# Patient Record
Sex: Female | Born: 1995 | Hispanic: No | Marital: Married | Smoking: Never smoker
Health system: Southern US, Community
[De-identification: ages and names within clinical notes are randomized; demographics above are authoritative.]

## PROBLEM LIST (undated history)

## (undated) DIAGNOSIS — O169 Unspecified maternal hypertension, unspecified trimester: Secondary | ICD-10-CM

## (undated) DIAGNOSIS — O24419 Gestational diabetes mellitus in pregnancy, unspecified control: Secondary | ICD-10-CM

## (undated) HISTORY — DX: Unspecified maternal hypertension, unspecified trimester: O16.9

## (undated) HISTORY — DX: Gestational diabetes mellitus in pregnancy, unspecified control: O24.419

## (undated) HISTORY — PX: NO PAST SURGERIES: SHX2092

---

## 2020-01-01 NOTE — L&D Delivery Note (Signed)
Delivery Note Progressed to complete dilation with urge to push. Had her start pushing, and she did well.  FHR developed decelerations to 40s with contractions, with descent of head Neonatologist called to room  One attempt made at vacuum assistance with one contraciton.  No descent so no further attempts since head wasn't at optimal station.  Dr Shawnie Pons arrived and was able to assist patient with descent with pushes to delivery.     At 6:56 AM a viable and healthy female was delivered via Vaginal, Spontaneous (Presentation: Middle Occiput Posterior).  APGAR: 6, 8; weight  .   Placenta status: Spontaneous, Intact.  Cord: Unknown with the following complications: None.  Cord pH: pending   Anesthesia: Local Episiotomy: None Lacerations: Sulcus Suture Repair: 3.0 vicryl Est. Blood Loss (mL): 856  Mom to postpartum.  Baby to Couplet care / Skin to Skin.  Wynelle Bourgeois 12/22/2020, 8:16 AM

## 2020-06-29 ENCOUNTER — Ambulatory Visit (INDEPENDENT_AMBULATORY_CARE_PROVIDER_SITE_OTHER): Payer: Medicaid Other

## 2020-06-29 DIAGNOSIS — Z3A13 13 weeks gestation of pregnancy: Secondary | ICD-10-CM

## 2020-06-29 DIAGNOSIS — O0990 Supervision of high risk pregnancy, unspecified, unspecified trimester: Secondary | ICD-10-CM

## 2020-06-29 MED ORDER — DOXYLAMINE-PYRIDOXINE 10-10 MG PO TBEC: 2.0000 | DELAYED_RELEASE_TABLET | Freq: Every day | ORAL | 5 refills | Status: DC

## 2020-06-29 NOTE — Progress Notes (Signed)
.    Virtual Visit via Telephone Note  I connected with Joanna Rogers on 06/29/20 at  9:00 AM EDT by telephone and verified that I am speaking with the correct person using two identifiers.  Location: Patient: Joanna Rogers  Provider: Eduard Roux, CMA    I discussed the limitations, risks, security and privacy concerns of performing an evaluation and management service by telephone and the availability of in person appointments. I also discussed with the patient that there may be a patient responsible charge related to this service. The patient expressed understanding and agreed to proceed.   History of Present Illness: PRENATAL INTAKE SUMMARY  Ms. Joanna Rogers presents today New OB Nurse Interview.  OB History    Gravida  1   Para      Term      Preterm      AB      Living        SAB      TAB      Ectopic      Multiple      Live Births             I have reviewed the patient's medical, obstetrical, social, and family histories, medications, and available lab results.  SUBJECTIVE She complains of nausea with vomiting for 2 days.    Observations/Objective: Initial nurse interview for history/labs (New OB)  EDD: 01/04/2021 G1P0 FHT: non face to face interview  GENERAL APPEARANCE: alert, oriented to person, place and time, non face to face interview   Assessment and Plan: Prenatal Care - [facility]  High Risk pregnancy - HTN and suspected diabetes (pt unsure) Labs/Pap to be completed at NOB visit 7/6 Download MyChart Babyscripts discussed in detail  Pt has a BP cuff and scale  Diclegis sent to the pharmacy for N&V - if sx's do not subside, contact the office or report to MAU after hours   Follow Up Instructions:   I discussed the assessment and treatment plan with the patient. The patient was provided an opportunity to ask questions and all were answered. The patient agreed with the plan and demonstrated an understanding of the instructions.   The patient  was advised to call back or seek an in-person evaluation if the symptoms worsen or if the condition fails to improve as anticipated.  I provided 20 minutes of non-face-to-face time during this encounter.   Dalphine Handing, CMA

## 2020-07-05 ENCOUNTER — Encounter: Payer: Self-pay | Admitting: Obstetrics and Gynecology

## 2020-07-05 ENCOUNTER — Other Ambulatory Visit (HOSPITAL_COMMUNITY)
Admission: RE | Admit: 2020-07-05 | Discharge: 2020-07-05 | Disposition: A | Discharge status: Home / Self Care | Payer: Medicaid Other | Source: Ambulatory Visit | Attending: Obstetrics and Gynecology | Admitting: Obstetrics and Gynecology

## 2020-07-05 ENCOUNTER — Other Ambulatory Visit: Payer: Self-pay

## 2020-07-05 ENCOUNTER — Ambulatory Visit (INDEPENDENT_AMBULATORY_CARE_PROVIDER_SITE_OTHER): Payer: Medicaid Other | Admitting: Obstetrics and Gynecology

## 2020-07-05 VITALS — BP 139/95 | HR 111 | Wt 260.3 lb

## 2020-07-05 DIAGNOSIS — Z3A13 13 weeks gestation of pregnancy: Secondary | ICD-10-CM

## 2020-07-05 DIAGNOSIS — O99511 Diseases of the respiratory system complicating pregnancy, first trimester: Secondary | ICD-10-CM

## 2020-07-05 DIAGNOSIS — O0991 Supervision of high risk pregnancy, unspecified, first trimester: Secondary | ICD-10-CM | POA: Diagnosis not present

## 2020-07-05 DIAGNOSIS — O0990 Supervision of high risk pregnancy, unspecified, unspecified trimester: Secondary | ICD-10-CM | POA: Diagnosis present

## 2020-07-05 DIAGNOSIS — O10912 Unspecified pre-existing hypertension complicating pregnancy, second trimester: Secondary | ICD-10-CM | POA: Insufficient documentation

## 2020-07-05 DIAGNOSIS — O10911 Unspecified pre-existing hypertension complicating pregnancy, first trimester: Secondary | ICD-10-CM

## 2020-07-05 DIAGNOSIS — E7439 Other disorders of intestinal carbohydrate absorption: Secondary | ICD-10-CM

## 2020-07-05 DIAGNOSIS — J45909 Unspecified asthma, uncomplicated: Secondary | ICD-10-CM | POA: Diagnosis not present

## 2020-07-05 DIAGNOSIS — O99810 Abnormal glucose complicating pregnancy: Secondary | ICD-10-CM

## 2020-07-05 DIAGNOSIS — O99512 Diseases of the respiratory system complicating pregnancy, second trimester: Secondary | ICD-10-CM | POA: Insufficient documentation

## 2020-07-05 MED ORDER — PROMETHAZINE HCL 25 MG RE SUPP: 25.0000 mg | Freq: Four times a day (QID) | RECTAL | 1 refills | Status: DC | PRN

## 2020-07-05 MED ORDER — LABETALOL HCL 100 MG PO TABS: 100.0000 mg | ORAL_TABLET | Freq: Two times a day (BID) | ORAL | 10 refills | Status: DC

## 2020-07-05 NOTE — Progress Notes (Signed)
Subjective:  Joanna Rogers is a G1P0 [redacted]w[redacted]d being seen today for her first obstetrical visit.  Her obstetrical history is significant for non-applicable.  This is her first pregnancy.. Patient does intend to breast feed. Pregnancy history fully reviewed.  Patient reports no complaints.  She does state that she has a history of hypertension, treated with Lisinopril.  She has not been taking the medication for a few months.  The patient states that she was told by her PCP about 1 year ago, that her Hb A1C was elevated.  She was started on metformin.  The patient has not been taking the medication lately secondary to increased nausea.  BP (!) 139/95   Pulse (!) 111   Wt 118.1 kg   LMP 03/30/2020   BMI 38.44 kg/m   HISTORY: OB History  Gravida Para Term Preterm AB Living  1            SAB TAB Ectopic Multiple Live Births               # Outcome Date GA Lbr Len/2nd Weight Sex Delivery Anes PTL Lv  1 Current             Past Medical History:  Diagnosis Date  . Hypertension affecting pregnancy     History reviewed. No pertinent surgical history.  Family History  Problem Relation Age of Onset  . High Cholesterol Mother   . Hypertension Father   . Diabetes Father   . Kidney Stones Father   . Breast cancer Maternal Grandmother   . Cervical cancer Maternal Grandmother   . Hypertension Maternal Grandfather   . Kidney Stones Paternal Grandmother   . Clotting disorder Paternal Grandmother   . Diabetes Paternal Grandfather   . Hypertension Paternal Grandfather      Exam  BP (!) 139/95   Pulse (!) 111   Wt 118.1 kg   LMP 03/30/2020   BMI 38.44 kg/m   Chaperone present during exam  CONSTITUTIONAL: Well-developed, well-nourished female in no acute distress.  HENT:  Normocephalic, atraumatic, External right and left ear normal. Oropharynx is clear and moist EYES: Conjunctivae and EOM are normal. Pupils are equal, round, and reactive to light. No scleral icterus.  NECK:  Normal range of motion, supple, no masses.  Normal thyroid.  CARDIOVASCULAR: Normal heart rate noted, regular rhythm RESPIRATORY: Clear to auscultation bilaterally. Effort and breath sounds normal, no problems with respiration noted. BREASTS: Symmetric in size. No masses, skin changes, nipple drainage, or lymphadenopathy. ABDOMEN: Soft, normal bowel sounds, no distention noted.  No tenderness, rebound or guarding.  PELVIC: Normal appearing external genitalia; normal appearing vaginal mucosa and cervix. No abnormal discharge noted. Normal uterine size, no other palpable masses, no uterine or adnexal tenderness. MUSCULOSKELETAL: Normal range of motion. No tenderness.  No cyanosis, clubbing, or edema.  2+ distal pulses. SKIN: Skin is warm and dry. No rash noted. Not diaphoretic. No erythema. No pallor. NEUROLOGIC: Alert and oriented to person, place, and time. Normal reflexes, muscle tone coordination. No cranial nerve deficit noted. PSYCHIATRIC: Normal mood and affect. Normal behavior. Normal judgment and thought content.    Assessment:    Pregnancy: G1P0 Patient Active Problem List   Diagnosis Date Noted  . Chronic hypertension in obstetric context in second trimester 07/05/2020  . Asthma affecting pregnancy in second trimester 07/05/2020  . Glucose intolerance 07/05/2020  . Supervision of high risk pregnancy, antepartum 06/29/2020      Plan:   1. Supervision of high risk pregnancy,  antepartum Prenatal labs, including Pap smear and cultures collected today. OB ultrasound ordered. Genetic screening ordered.  2. Chronic hypertension in obstetric context in second trimester Patient informed to not restart the Lisinopril. Rx for Labetalol sent to pharmacy. Baseline 24 hour urine and CMP ordered. Patient to return in 1 week for BP check.  3. Asthma affecting pregnancy in second trimester Patient to continue with current inhalers.  She states that she has multiple refills.    4.  Glucose intolerance We will not restart Metformin at this time. Hb A1c ordered.     Problem list reviewed and updated. 50% of 40 min visit spent on counseling and coordination of care.  Patient voiced understanding of plan of care.  All questions answered.     Levie Heritage 07/05/2020

## 2020-07-05 NOTE — Progress Notes (Signed)
Pt presents for NOB visit c/o nausea and vomiting.  Unable to pick up Diclegis d/t insurance issues Pap due today

## 2020-07-05 NOTE — Addendum Note (Signed)
Addended by: Dalphine Handing on: 07/05/2020 04:31 PM   Modules accepted: Orders

## 2020-07-06 LAB — HEMOGLOBIN A1C
Est. average glucose Bld gHb Est-mCnc: 143 mg/dL
Hgb A1c MFr Bld: 6.6 % — ABNORMAL HIGH (ref 4.8–5.6)

## 2020-07-06 LAB — CBC/D/PLT+RPR+RH+ABO+RUB AB...
Antibody Screen: NEGATIVE
Basophils Absolute: 0 10*3/uL (ref 0.0–0.2)
Basos: 0 %
EOS (ABSOLUTE): 0.1 10*3/uL (ref 0.0–0.4)
Eos: 1 %
HCV Ab: <0.1 s/co ratio (ref 0.0–0.9)
HIV Screen 4th Generation wRfx: NONREACTIVE
Hematocrit: 39.2 % (ref 34.0–46.6)
Hemoglobin: 12.5 g/dL (ref 11.1–15.9)
Hepatitis B Surface Ag: NEGATIVE
Immature Grans (Abs): 0.1 10*3/uL (ref 0.0–0.1)
Immature Granulocytes: 1 %
Lymphocytes Absolute: 2.1 10*3/uL (ref 0.7–3.1)
Lymphs: 23 %
MCH: 27.2 pg (ref 26.6–33.0)
MCHC: 31.9 g/dL (ref 31.5–35.7)
MCV: 85 fL (ref 79–97)
Monocytes Absolute: 0.6 10*3/uL (ref 0.1–0.9)
Monocytes: 6 %
Neutrophils Absolute: 6 10*3/uL (ref 1.4–7.0)
Neutrophils: 69 %
Platelets: 269 10*3/uL (ref 150–450)
RBC: 4.59 x10E6/uL (ref 3.77–5.28)
RDW: 13.4 % (ref 11.7–15.4)
RPR Ser Ql: NONREACTIVE
Rh Factor: POSITIVE
Rubella Antibodies, IGG: 2.12 index (ref >0.99)
WBC: 8.8 10*3/uL (ref 3.4–10.8)

## 2020-07-06 LAB — CERVICOVAGINAL ANCILLARY ONLY
Bacterial Vaginitis (gardnerella): NEGATIVE
Candida Glabrata: NEGATIVE
Candida Vaginitis: NEGATIVE
Chlamydia: NEGATIVE
Comment: NEGATIVE
Comment: NEGATIVE
Comment: NEGATIVE
Comment: NEGATIVE
Comment: NEGATIVE
Comment: NORMAL
Neisseria Gonorrhea: NEGATIVE
Trichomonas: NEGATIVE

## 2020-07-06 LAB — CYTOLOGY - PAP: Diagnosis: NEGATIVE

## 2020-07-06 LAB — HCV INTERPRETATION

## 2020-07-07 ENCOUNTER — Other Ambulatory Visit: Payer: Self-pay | Admitting: Obstetrics and Gynecology

## 2020-07-07 LAB — URINE CULTURE, OB REFLEX

## 2020-07-07 LAB — CULTURE, OB URINE

## 2020-07-07 NOTE — Addendum Note (Signed)
Addended by: Eldred Manges on: 07/07/2020 01:08 PM   Modules accepted: Orders

## 2020-07-07 NOTE — Addendum Note (Signed)
Addended by: Jenene Slicker on: 07/07/2020 01:16 PM   Modules accepted: Orders

## 2020-07-08 LAB — PROTEIN, URINE, 24 HOUR
Protein, 24H Urine: 291 mg/24 hr — ABNORMAL HIGH (ref 30–150)
Protein, Ur: 18.2 mg/dL

## 2020-07-08 LAB — CREATININE, URINE, 24 HOUR
Creatinine, 24H Ur: 1541 mg/24 hr (ref 800–1800)
Creatinine, Urine: 96.3 mg/dL

## 2020-07-12 ENCOUNTER — Other Ambulatory Visit: Payer: Self-pay

## 2020-07-12 ENCOUNTER — Ambulatory Visit (INDEPENDENT_AMBULATORY_CARE_PROVIDER_SITE_OTHER): Payer: Medicaid Other | Admitting: Obstetrics and Gynecology

## 2020-07-12 ENCOUNTER — Encounter: Payer: Self-pay | Admitting: Obstetrics and Gynecology

## 2020-07-12 VITALS — BP 135/93 | HR 120 | Wt 258.0 lb

## 2020-07-12 DIAGNOSIS — O0992 Supervision of high risk pregnancy, unspecified, second trimester: Secondary | ICD-10-CM

## 2020-07-12 DIAGNOSIS — O0990 Supervision of high risk pregnancy, unspecified, unspecified trimester: Secondary | ICD-10-CM

## 2020-07-12 DIAGNOSIS — O10912 Unspecified pre-existing hypertension complicating pregnancy, second trimester: Secondary | ICD-10-CM

## 2020-07-12 DIAGNOSIS — J45909 Unspecified asthma, uncomplicated: Secondary | ICD-10-CM

## 2020-07-12 DIAGNOSIS — O99512 Diseases of the respiratory system complicating pregnancy, second trimester: Secondary | ICD-10-CM

## 2020-07-12 MED ORDER — ASPIRIN 81 MG PO CHEW: 81.0000 mg | CHEWABLE_TABLET | Freq: Every day | ORAL | Status: DC

## 2020-07-12 NOTE — Progress Notes (Signed)
   PRENATAL VISIT NOTE  Subjective:  Joanna Rogers is a 24 y.o. G1P0 at [redacted]w[redacted]d being seen today for ongoing prenatal care.  She is currently monitored for the following issues for this high-risk pregnancy and has Supervision of high risk pregnancy, antepartum; Chronic hypertension in obstetric context in second trimester; Asthma affecting pregnancy in second trimester; and Glucose intolerance on their problem list.  Patient doing well with no acute concerns today. She reports no complaints.  Contractions: Not present. Vag. Bleeding: None.  Movement: Present. Denies leaking of fluid.    Hgb A1c slightly elevated.  Pt states when she was seen elsewhere her blood sugars were becoming elevated. The following portions of the patient's history were reviewed and updated as appropriate: allergies, current medications, past family history, past medical history, past social history, past surgical history and problem list. Problem list updated.  Objective:   Vitals:   07/12/20 1553  BP: (!) 135/93  Pulse: (!) 120  Weight: 258 lb (117 kg)    Fetal Status: Fetal Heart Rate (bpm): 160   Movement: Present     General:  Alert, oriented and cooperative. Patient is in no acute distress.  Skin: Skin is warm and dry. No rash noted.   Cardiovascular: Normal heart rate noted  Respiratory: Normal respiratory effort, no problems with respiration noted  Abdomen: Soft, obese,gravid, appropriate for gestational age.  Pain/Pressure: Absent     Pelvic: Cervical exam deferred        Extremities: Normal range of motion.     Mental Status:  Normal mood and affect. Normal behavior. Normal judgment and thought content.   Assessment and Plan:  Pregnancy: G1P0 at [redacted]w[redacted]d  1. Supervision of high risk pregnancy, antepartum Check BP and 2 hour GTT in 2 weeks, pt to put BP in mychart  2. Chronic hypertension in obstetric context in second trimester Start low dose ASA now  3. Asthma affecting pregnancy in second  trimester   Preterm labor symptoms and general obstetric precautions including but not limited to vaginal bleeding, contractions, leaking of fluid and fetal movement were reviewed in detail with the patient.  Please refer to After Visit Summary for other counseling recommendations.   Return in about 4 weeks (around 08/09/2020) for [facility], in person, AFP.   Mariel Aloe, MD

## 2020-07-12 NOTE — Patient Instructions (Signed)
Second Trimester of Pregnancy  The second trimester is from week 14 through week 27 (month 4 through 6). This is often the time in pregnancy that you feel your best. Often times, morning sickness has lessened or quit. You may have more energy, and you may get hungry more often. Your unborn baby is growing rapidly. At the end of the sixth month, he or she is about 9 inches long and weighs about 1 pounds. You will likely feel the baby move between 18 and 20 weeks of pregnancy. Follow these instructions at home: Medicines  Take over-the-counter and prescription medicines only as told by your doctor. Some medicines are safe and some medicines are not safe during pregnancy.  Take a prenatal vitamin that contains at least 600 micrograms (mcg) of folic acid.  If you have trouble pooping (constipation), take medicine that will make your stool soft (stool softener) if your doctor approves. Eating and drinking   Eat regular, healthy meals.  Avoid raw meat and uncooked cheese.  If you get low calcium from the food you eat, talk to your doctor about taking a daily calcium supplement.  Avoid foods that are high in fat and sugars, such as fried and sweet foods.  If you feel sick to your stomach (nauseous) or throw up (vomit): ? Eat 4 or 5 small meals a day instead of 3 large meals. ? Try eating a few soda crackers. ? Drink liquids between meals instead of during meals.  To prevent constipation: ? Eat foods that are high in fiber, like fresh fruits and vegetables, whole grains, and beans. ? Drink enough fluids to keep your pee (urine) clear or pale yellow. Activity  Exercise only as told by your doctor. Stop exercising if you start to have cramps.  Do not exercise if it is too hot, too humid, or if you are in a place of great height (high altitude).  Avoid heavy lifting.  Wear low-heeled shoes. Sit and stand up straight.  You can continue to have sex unless your doctor tells you not  to. Relieving pain and discomfort  Wear a good support bra if your breasts are tender.  Take warm water baths (sitz baths) to soothe pain or discomfort caused by hemorrhoids. Use hemorrhoid cream if your doctor approves.  Rest with your legs raised if you have leg cramps or low back pain.  If you develop puffy, bulging veins (varicose veins) in your legs: ? Wear support hose or compression stockings as told by your doctor. ? Raise (elevate) your feet for 15 minutes, 3-4 times a day. ? Limit salt in your food. Prenatal care  Write down your questions. Take them to your prenatal visits.  Keep all your prenatal visits as told by your doctor. This is important. Safety  Wear your seat belt when driving.  Make a list of emergency phone numbers, including numbers for family, friends, the hospital, and police and fire departments. General instructions  Ask your doctor about the right foods to eat or for help finding a counselor, if you need these services.  Ask your doctor about local prenatal classes. Begin classes before month 6 of your pregnancy.  Do not use hot tubs, steam rooms, or saunas.  Do not douche or use tampons or scented sanitary pads.  Do not cross your legs for long periods of time.  Visit your dentist if you have not done so. Use a soft toothbrush to brush your teeth. Floss gently.  Avoid all smoking, herbs,   and alcohol. Avoid drugs that are not approved by your doctor.  Do not use any products that contain nicotine or tobacco, such as cigarettes and e-cigarettes. If you need help quitting, ask your doctor.  Avoid cat litter boxes and soil used by cats. These carry germs that can cause birth defects in the baby and can cause a loss of your baby (miscarriage) or stillbirth. Contact a doctor if:  You have mild cramps or pressure in your lower belly.  You have pain when you pee (urinate).  You have bad smelling fluid coming from your vagina.  You continue to  feel sick to your stomach (nauseous), throw up (vomit), or have watery poop (diarrhea).  You have a nagging pain in your belly area.  You feel dizzy. Get help right away if:  You have a fever.  You are leaking fluid from your vagina.  You have spotting or bleeding from your vagina.  You have severe belly cramping or pain.  You lose or gain weight rapidly.  You have trouble catching your breath and have chest pain.  You notice sudden or extreme puffiness (swelling) of your face, hands, ankles, feet, or legs.  You have not felt the baby move in over an hour.  You have severe headaches that do not go away when you take medicine.  You have trouble seeing. Summary  The second trimester is from week 14 through week 27 (months 4 through 6). This is often the time in pregnancy that you feel your best.  To take care of yourself and your unborn baby, you will need to eat healthy meals, take medicines only if your doctor tells you to do so, and do activities that are safe for you and your baby.  Call your doctor if you get sick or if you notice anything unusual about your pregnancy. Also, call your doctor if you need help with the right food to eat, or if you want to know what activities are safe for you. This information is not intended to replace advice given to you by your health care provider. Make sure you discuss any questions you have with your health care provider. Document Revised: 04/10/2019 Document Reviewed: 01/22/2017 Elsevier Patient Education  2020 [employer]. Hypertension During Pregnancy Hypertension is also called high blood pressure. High blood pressure means that the force of your blood moving in your body is too strong. It can cause problems for you and your baby. Different types of high blood pressure can happen during pregnancy. The types are:  High blood pressure before you got pregnant. This is called chronic hypertension.  This can continue during your  pregnancy. Your doctor will want to keep checking your blood pressure. You may need medicine to keep your blood pressure under control while you are pregnant. You will need follow-up visits after you have your baby.  High blood pressure that goes up during pregnancy when it was normal before. This is called gestational hypertension. It will usually get better after you have your baby, but your doctor will need to watch your blood pressure to make sure that it is getting better.  Very high blood pressure during pregnancy. This is called preeclampsia. Very high blood pressure is an emergency that needs to be checked and treated right away.  You may develop very high blood pressure after giving birth. This is called postpartum preeclampsia. This usually occurs within 48 hours after childbirth but may occur up to 6 weeks after giving birth. This is  rare. How does this affect me? If you have high blood pressure during pregnancy, you have a higher chance of developing high blood pressure:  As you get older.  If you get pregnant again. In some cases, high blood pressure during pregnancy can cause:  Stroke.  Heart attack.  Damage to the kidneys, lungs, or liver.  Preeclampsia.  Jerky movements you cannot control (convulsions or seizures).  Problems with the placenta. How does this affect my baby? Your baby may:  Be born early.  Not weigh as much as he or she should.  Not handle labor well, leading to a c-section birth. What are the risks?  Having high blood pressure during a past pregnancy.  Being overweight.  Being 40 years old or older.  Being pregnant for the first time.  Being pregnant with more than one baby.  Becoming pregnant using fertility methods, such as IVF.  Having other problems, such as diabetes, or kidney disease.  Having family members who have high blood pressure. What can I do to lower my risk?   Keep a healthy weight.  Eat a healthy diet.  Follow  what your doctor tells you about treating any medical problems that you had before becoming pregnant. It is very important to go to all of your doctor visits. Your doctor will check your blood pressure and make sure that your pregnancy is progressing as it should. Treatment should start early if a problem is found. How is this treated? Treatment for high blood pressure during pregnancy can differ depending on the type of high blood pressure you have and how serious it is.  You may need to take blood pressure medicine.  If you have been taking medicine for your blood pressure, you may need to change the medicine during pregnancy if it is not safe for your baby.  If your doctor thinks that you could get very high blood pressure, he or she may tell you to take a low-dose aspirin during your pregnancy.  If you have very high blood pressure, you may need to stay in the hospital so you and your baby can be watched closely. You may also need to take medicine to lower your blood pressure. This medicine may be given by mouth or through an IV tube.  In some cases, if your condition gets worse, you may need to have your baby early. Follow these instructions at home: Eating and drinking   Drink enough fluid to keep your pee (urine) pale yellow.  Avoid caffeine. Lifestyle  Do not use any products that contain nicotine or tobacco, such as cigarettes, e-cigarettes, and chewing tobacco. If you need help quitting, ask your doctor.  Do not use alcohol or drugs.  Avoid stress.  Rest and get plenty of sleep.  Regular exercise can help. Ask your doctor what kinds of exercise are best for you. General instructions  Take over-the-counter and prescription medicines only as told by your doctor.  Keep all prenatal and follow-up visits as told by your doctor. This is important. Contact a doctor if:  You have symptoms that your doctor told you to watch for, such  as: ? Headaches. ? Nausea. ? Vomiting. ? Belly (abdominal) pain. ? Dizziness. ? Light-headedness. Get help right away if:  You have: ? Very bad belly pain that does not get better with treatment. ? A very bad headache that does not get better. ? Vomiting that does not get better. ? Sudden, fast weight gain. ? Sudden swelling in your  hands, ankles, or face. ? Bleeding from your vagina. ? Blood in your pee. ? Blurry vision. ? Double vision. ? Shortness of breath. ? Chest pain. ? Weakness on one side of your body. ? Trouble talking.  Your baby is not moving as much as usual. Summary  High blood pressure is also called hypertension.  High blood pressure means that the force of your blood moving in your body is too strong.  High blood pressure can cause problems for you and your baby.  Keep all follow-up visits as told by your doctor. This is important. This information is not intended to replace advice given to you by your health care provider. Make sure you discuss any questions you have with your health care provider. Document Revised: 04/09/2019 Document Reviewed: 01/13/2019 Elsevier Patient Education  2020 [employer].

## 2020-07-12 NOTE — Progress Notes (Signed)
Pt started on Labetalol today- has had one dose.

## 2020-07-18 ENCOUNTER — Encounter: Payer: Self-pay | Admitting: Obstetrics and Gynecology

## 2020-07-20 NOTE — Progress Notes (Signed)
Patient ID: Joanna Rogers, female   DOB: 05/02/1996, 24 y.o.   MRN: [id number] Patient was assessed and managed by nursing staff during this encounter. I have reviewed the chart and agree with the documentation and plan. I have also made any necessary editorial changes.  Scheryl Darter, MD 07/20/2020 1:39 PM

## 2020-07-27 ENCOUNTER — Other Ambulatory Visit: Payer: Self-pay

## 2020-07-27 ENCOUNTER — Other Ambulatory Visit: Payer: Medicaid Other

## 2020-07-27 ENCOUNTER — Ambulatory Visit: Payer: Medicaid Other

## 2020-07-27 DIAGNOSIS — O0990 Supervision of high risk pregnancy, unspecified, unspecified trimester: Secondary | ICD-10-CM

## 2020-07-27 DIAGNOSIS — R7309 Other abnormal glucose: Secondary | ICD-10-CM

## 2020-07-27 DIAGNOSIS — O10912 Unspecified pre-existing hypertension complicating pregnancy, second trimester: Secondary | ICD-10-CM

## 2020-07-27 MED ORDER — LABETALOL HCL 200 MG PO TABS
200.0000 mg | ORAL_TABLET | Freq: Two times a day (BID) | ORAL | 3 refills | Status: DC
Start: 2020-07-27 — End: 2020-10-31

## 2020-07-27 NOTE — Progress Notes (Signed)
..  Subjective:  Joanna Rogers is a 24 y.o. female here for BP check.   Hypertension ROS: taking medications as instructed, no medication side effects noted, no TIA's, no chest pain on exertion, no dyspnea on exertion and no swelling of ankles.    Objective:  BP (!) 132/88   Pulse 94   LMP 03/30/2020   Appearance alert, well appearing, and in no distress. General exam BP noted to be well controlled today in office.    Assessment:   Blood Pressure borderline controlled.   Plan:  Orders and follow up as documented in patient record.  Provider ordered to increase labetalol to 200mg  two times daily, advised pt of the change in prescription, pt agreed. Advised to report any abnormal symptoms and follow up at next appt 08-09-20.

## 2020-07-28 ENCOUNTER — Telehealth: Payer: Self-pay

## 2020-07-28 ENCOUNTER — Other Ambulatory Visit (INDEPENDENT_AMBULATORY_CARE_PROVIDER_SITE_OTHER): Payer: Medicaid Other | Admitting: Obstetrics and Gynecology

## 2020-07-28 ENCOUNTER — Other Ambulatory Visit: Payer: Self-pay

## 2020-07-28 DIAGNOSIS — O99810 Abnormal glucose complicating pregnancy: Secondary | ICD-10-CM

## 2020-07-28 DIAGNOSIS — O24419 Gestational diabetes mellitus in pregnancy, unspecified control: Secondary | ICD-10-CM

## 2020-07-28 LAB — GLUCOSE TOLERANCE, 2 HOURS W/ 1HR
Glucose, 1 hour: 165 mg/dL (ref 65–179)
Glucose, 2 hour: 158 mg/dL — ABNORMAL HIGH (ref 65–152)
Glucose, Fasting: 100 mg/dL — ABNORMAL HIGH (ref 65–91)

## 2020-07-28 MED ORDER — ACCU-CHEK GUIDE VI STRP: ORAL_STRIP | 12 refills | Status: DC

## 2020-07-28 MED ORDER — ACCU-CHEK SOFTCLIX LANCETS MISC: 1.0000 | Freq: Four times a day (QID) | 12 refills | Status: DC

## 2020-07-28 MED ORDER — ACCU-CHEK GUIDE W/DEVICE KIT: 1.0000 | PACK | Freq: Four times a day (QID) | 0 refills | Status: DC

## 2020-07-28 NOTE — Progress Notes (Signed)
Rx sent for Diabetes supplies  Pt not ava to make aware.  LVM

## 2020-07-28 NOTE — Telephone Encounter (Signed)
TC to pt regarding 2hr GTT No answer lvm supplies sent

## 2020-07-28 NOTE — Telephone Encounter (Signed)
-----   Message from Warden Fillers, MD sent at 07/28/2020  1:23 PM EDT ----- 2 hour GTT failed, will refer to diabetic teaching

## 2020-08-03 ENCOUNTER — Other Ambulatory Visit: Payer: Self-pay

## 2020-08-03 ENCOUNTER — Encounter: Payer: Medicaid Other | Attending: Obstetrics and Gynecology | Admitting: Registered"

## 2020-08-03 DIAGNOSIS — O24419 Gestational diabetes mellitus in pregnancy, unspecified control: Secondary | ICD-10-CM | POA: Diagnosis present

## 2020-08-04 NOTE — Progress Notes (Signed)
Order placed for diabetes teaching

## 2020-08-05 ENCOUNTER — Encounter: Payer: Self-pay | Admitting: Registered"

## 2020-08-05 DIAGNOSIS — O24419 Gestational diabetes mellitus in pregnancy, unspecified control: Secondary | ICD-10-CM | POA: Insufficient documentation

## 2020-08-05 NOTE — Progress Notes (Signed)
Patient was seen on 08/03/20 for Gestational Diabetes self-management class at [facility]. The following learning objectives were met by the patient during this course: ° °· States the definition of Gestational Diabetes °· States why dietary management is important in controlling blood glucose °· Describes the effects each nutrient has on blood glucose levels °· Demonstrates ability to create a balanced meal plan °· Demonstrates carbohydrate counting  °· States when to check blood glucose levels °· Demonstrates proper blood glucose monitoring techniques °· States the effect of stress and exercise on blood glucose levels °· States the importance of limiting caffeine and abstaining from alcohol and smoking ° °Blood glucose monitor given: none ° °Patient instructed to monitor glucose levels: °FBS: 60 - <95; 1 hour: <140; 2 hour: <120 ° °Patient received handouts: °· Nutrition Diabetes and Pregnancy, including carb counting list ° °Patient will be seen for follow-up as needed. °

## 2020-08-06 NOTE — Progress Notes (Signed)
Patient ID: Joanna Rogers, female   DOB: 07/18/1996, 24 y.o.   MRN: [id number] Patient was assessed and managed by nursing staff during this encounter. I have reviewed the chart and agree with the documentation and plan. I have also made any necessary editorial changes.  Scheryl Darter, MD 08/06/2020 6:48 PM

## 2020-08-09 ENCOUNTER — Ambulatory Visit (INDEPENDENT_AMBULATORY_CARE_PROVIDER_SITE_OTHER): Payer: Medicaid Other | Admitting: Obstetrics and Gynecology

## 2020-08-09 ENCOUNTER — Ambulatory Visit: Payer: Medicaid Other

## 2020-08-09 ENCOUNTER — Ambulatory Visit: Payer: Medicaid Other | Attending: Obstetrics and Gynecology

## 2020-08-09 ENCOUNTER — Other Ambulatory Visit: Payer: Self-pay | Admitting: *Deleted

## 2020-08-09 ENCOUNTER — Ambulatory Visit: Payer: Medicaid Other | Admitting: *Deleted

## 2020-08-09 ENCOUNTER — Other Ambulatory Visit: Payer: Self-pay

## 2020-08-09 VITALS — BP 120/81 | HR 98

## 2020-08-09 VITALS — BP 139/90 | HR 109 | Wt 261.1 lb

## 2020-08-09 DIAGNOSIS — O0990 Supervision of high risk pregnancy, unspecified, unspecified trimester: Secondary | ICD-10-CM

## 2020-08-09 DIAGNOSIS — E669 Obesity, unspecified: Secondary | ICD-10-CM | POA: Diagnosis not present

## 2020-08-09 DIAGNOSIS — O10912 Unspecified pre-existing hypertension complicating pregnancy, second trimester: Secondary | ICD-10-CM

## 2020-08-09 DIAGNOSIS — O99512 Diseases of the respiratory system complicating pregnancy, second trimester: Secondary | ICD-10-CM

## 2020-08-09 DIAGNOSIS — O132 Gestational [pregnancy-induced] hypertension without significant proteinuria, second trimester: Secondary | ICD-10-CM

## 2020-08-09 DIAGNOSIS — O24415 Gestational diabetes mellitus in pregnancy, controlled by oral hypoglycemic drugs: Secondary | ICD-10-CM | POA: Diagnosis not present

## 2020-08-09 DIAGNOSIS — Z363 Encounter for antenatal screening for malformations: Secondary | ICD-10-CM | POA: Diagnosis not present

## 2020-08-09 DIAGNOSIS — J45909 Unspecified asthma, uncomplicated: Secondary | ICD-10-CM

## 2020-08-09 DIAGNOSIS — O99891 Other specified diseases and conditions complicating pregnancy: Secondary | ICD-10-CM

## 2020-08-09 DIAGNOSIS — Z3A18 18 weeks gestation of pregnancy: Secondary | ICD-10-CM

## 2020-08-09 DIAGNOSIS — O99212 Obesity complicating pregnancy, second trimester: Secondary | ICD-10-CM

## 2020-08-09 DIAGNOSIS — O24419 Gestational diabetes mellitus in pregnancy, unspecified control: Secondary | ICD-10-CM

## 2020-08-09 DIAGNOSIS — I10 Essential (primary) hypertension: Secondary | ICD-10-CM | POA: Diagnosis present

## 2020-08-09 DIAGNOSIS — O10919 Unspecified pre-existing hypertension complicating pregnancy, unspecified trimester: Secondary | ICD-10-CM

## 2020-08-09 LAB — POCT URINALYSIS DIPSTICK OB: Glucose, UA: NEGATIVE

## 2020-08-09 MED ORDER — GLYBURIDE 2.5 MG PO TABS: 2.5000 mg | ORAL_TABLET | Freq: Two times a day (BID) | ORAL | 3 refills | Status: DC

## 2020-08-09 NOTE — Progress Notes (Signed)
Subjective:  Joanna Rogers is a 24 y.o. G1P0 at [redacted]w[redacted]d being seen today for ongoing prenatal care.  She is currently monitored for the following issues for this high-risk pregnancy and has Supervision of high risk pregnancy, antepartum; Chronic hypertension in obstetric context in second trimester; Asthma affecting pregnancy in second trimester; Glucose intolerance; and Gestational diabetes mellitus (GDM), antepartum on their problem list.  GDM: Patient taking metformin 500 mg daily even-though she was told to not restart the metformin..  Reports no hypoglycemic episodes.  Tolerating medication well Fasting: 106-140 2hr PP: 110-159  Patient reports no complaints.  Contractions: Not present. Vag. Bleeding: None.  Movement: Present. Denies leaking of fluid.   The following portions of the patient's history were reviewed and updated as appropriate: allergies, current medications, past family history, past medical history, past social history, past surgical history and problem list. Problem list updated.  Objective:   Vitals:   08/09/20 1128  BP: 139/90  Pulse: (!) 109  Weight: 261 lb 1.6 oz (118.4 kg)    Fetal Status: Fetal Heart Rate (bpm): 153   Movement: Present     General:  Alert, oriented and cooperative. Patient is in no acute distress.  Skin: Skin is warm and dry. No rash noted.   Cardiovascular: Normal heart rate noted  Respiratory: Normal respiratory effort, no problems with respiration noted  Abdomen: Soft, gravid, appropriate for gestational age. Pain/Pressure: Absent     Pelvic: Vag. Bleeding: None     Cervical exam deferred        Extremities: Normal range of motion.  Edema: None  Mental Status: Normal mood and affect. Normal behavior. Normal judgment and thought content.   Urinalysis:      Assessment and Plan:  Pregnancy: G1P0 at [redacted]w[redacted]d  1. Supervision of high risk pregnancy, antepartum  - AFP, Serum, Open Spina Bifida - POC Urinalysis Dipstick OB  2. Gestational  diabetes mellitus (GDM), antepartum, gestational diabetes method of control unspecified - Patient to discontinue Metformin secondary to [facility] in pregnancy. - Insulin therapy recommended.  Patient declines.  Will start glyburide 2.5 mg bid. - Patient to continue glucose log.  3. Chronic hypertension in obstetric context in second trimester - Patient took her BP med right before the appointment this morning.  She is to start taking her morning dose by 8am.  She will return in 1 week for a BP check. - Continue ASA.  4. Asthma affecting pregnancy in second trimester - Stable.  Preterm labor symptoms and general obstetric precautions including but not limited to vaginal bleeding, contractions, leaking of fluid and fetal movement were reviewed in detail with the patient. Please refer to After Visit Summary for other counseling recommendations.  Return in about 1 week (around 08/16/2020) for HROB - BP and glucose check.   Johnny Bridge, MD

## 2020-08-11 LAB — AFP, SERUM, OPEN SPINA BIFIDA
AFP MoM: 0.84
AFP Value: 23.5 ng/mL
Gest. Age on Collection Date: 18.6 weeks
Maternal Age At EDD: 25.3 yr
OSBR Risk 1 IN: 6672
Test Results:: NEGATIVE
Weight: 261 [lb_av]

## 2020-08-15 ENCOUNTER — Other Ambulatory Visit: Payer: Self-pay

## 2020-08-15 ENCOUNTER — Encounter: Payer: Self-pay | Admitting: Obstetrics and Gynecology

## 2020-08-15 ENCOUNTER — Ambulatory Visit (INDEPENDENT_AMBULATORY_CARE_PROVIDER_SITE_OTHER): Payer: Medicaid Other | Admitting: Obstetrics and Gynecology

## 2020-08-15 VITALS — BP 123/85 | HR 97 | Wt 261.0 lb

## 2020-08-15 DIAGNOSIS — O24415 Gestational diabetes mellitus in pregnancy, controlled by oral hypoglycemic drugs: Secondary | ICD-10-CM

## 2020-08-15 DIAGNOSIS — O99512 Diseases of the respiratory system complicating pregnancy, second trimester: Secondary | ICD-10-CM

## 2020-08-15 DIAGNOSIS — O10912 Unspecified pre-existing hypertension complicating pregnancy, second trimester: Secondary | ICD-10-CM

## 2020-08-15 DIAGNOSIS — J45909 Unspecified asthma, uncomplicated: Secondary | ICD-10-CM

## 2020-08-15 DIAGNOSIS — O0990 Supervision of high risk pregnancy, unspecified, unspecified trimester: Secondary | ICD-10-CM

## 2020-08-15 MED ORDER — ASPIRIN EC 81 MG PO TBEC
81.0000 mg | DELAYED_RELEASE_TABLET | Freq: Every day | ORAL | 2 refills | Status: DC
Start: 2020-08-15 — End: 2020-12-24

## 2020-08-15 NOTE — Progress Notes (Signed)
   PRENATAL VISIT NOTE  Subjective:  Joanna Rogers is a 24 y.o. G1P0 at [redacted]w[redacted]d being seen today for ongoing prenatal care.  She is currently monitored for the following issues for this high-risk pregnancy and has Supervision of high risk pregnancy, antepartum; Chronic hypertension in obstetric context in second trimester; Asthma affecting pregnancy in second trimester; and Gestational diabetes mellitus (GDM), antepartum on their problem list.  Patient reports no complaints.  Contractions: Not present. Vag. Bleeding: None.  Movement: Present. Denies leaking of fluid.   The following portions of the patient's history were reviewed and updated as appropriate: allergies, current medications, past family history, past medical history, past social history, past surgical history and problem list.   Objective:   Vitals:   08/15/20 1051  BP: 123/85  Pulse: 97  Weight: 261 lb (118.4 kg)    Fetal Status: Fetal Heart Rate (bpm): 148   Movement: Present     General:  Alert, oriented and cooperative. Patient is in no acute distress.  Skin: Skin is warm and dry. No rash noted.   Cardiovascular: Normal heart rate noted  Respiratory: Normal respiratory effort, no problems with respiration noted  Abdomen: Soft, gravid, appropriate for gestational age.  Pain/Pressure: Absent     Pelvic: Cervical exam deferred        Extremities: Normal range of motion.  Edema: None  Mental Status: Normal mood and affect. Normal behavior. Normal judgment and thought content.   Assessment and Plan:  Pregnancy: G1P0 at [redacted]w[redacted]d 1. Supervision of high risk pregnancy, antepartum Patient is doing well without complaints Patient requested information on covid vaccine in pregnancy. The patient was counseled on the potential benefits and lack of known risks of COVID vaccination, during pregnancy and breastfeeding, on today's visit. The patient's questions and concerns were addressed today. The patient is planning to get  vaccinated.  2. Gestational diabetes mellitus (GDM) controlled on oral hypoglycemic drug, antepartum CBGs reviewed and fasting significantly improved on glyburide. A few elevated pp due to dietary indiscretions Patient scheduled for diabetic education tomorrow Continue glyburide 2.5 BID  Fetal echo scheduled Follow up anatomy ultrasound ordered  3. Chronic hypertension in obstetric context in second trimester BP well controlled on labetalol 200 BID ASA rx provided  4. Asthma affecting pregnancy in second trimester   Preterm labor symptoms and general obstetric precautions including but not limited to vaginal bleeding, contractions, leaking of fluid and fetal movement were reviewed in detail with the patient. Please refer to After Visit Summary for other counseling recommendations.   Return in about 4 weeks (around 09/12/2020) for in person, ROB, High risk.  Future Appointments  Date Time Provider Department Center  08/16/2020 10:15 AM [facility]  09/06/2020  1:30 PM [facility] NURSE [facility]  09/06/2020  1:45 PM [facility] US5 [facility]    Catalina Antigua, MD

## 2020-08-15 NOTE — Progress Notes (Signed)
Pt presents to f/u BP and glucose readings

## 2020-08-15 NOTE — Patient Instructions (Signed)
Coronavirus (COVID-19) and Pregnancy:  Frequently Asked Questions   How might coronavirus affect my pregnancy? The data for COVID-19 is limited, but we know that women with other coronavirus infections (such as SARS-CoV) did NOT have miscarriage or stillbirth at higher rates than the general population.  On the other hand, we know that having other respiratory viral infections during pregnancy, such as flu, has been associated with problems like low birth weight and preterm birth. Also, having a high fever early in pregnancy may increase the risk of certain birth defects.  Could I transmit coronavirus to my baby during pregnancy or delivery? Among the few case studies of infants born to mothers with COVID-19 published in peer-reviewed literature, none of the infants tested positive for the virus. And there have been no reports of mother-to-baby transmission for other coronaviruses (MERS-CoV and SARS-CoV). Also, there was no virus detected in samples of amniotic fluid or breast milk. But there have been a few reports of newborns as young as a few days old with infection, suggesting that a mother can transmit the infection to her infant through close contact after delivery.  Is it safe for me to deliver at a hospital where there have been COVID-19 cases? It should be. We know that COVID-19 is a very scary virus. The good news is that hospitals are taking great precautions to keep patients and healthcare providers safe.  According to the CDC guidelines, when a patient is even suspected to have COVID-19, they should be placed in a negative pressure room. (Think of these rooms as vacuums that suck and filter the air so it's safe for the other people in the hospital.) If there are no rooms available, these patients should be asked to wait at home until they can be accomodated safely. This should make it possible for you to deliver at the hospital without putting you or your baby at risk.  Hospitals are  also implementing stricter visiting policies to keep patients safe. It's worth calling your hospital to check if there are any new regulations to be aware of.  What plans should I make now in case the hospital system is overwhelmed when it's time for me to deliver? Every hospital is making different plans for dealing with this scenario. Talk with your doctor or midwife once you're at least [redacted] weeks pregnant. 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 Should I ask my doctor to excuse me from work until the baby is born? Should I ask my doctor to excuse me from work until the baby is born? What if I work in a school, the travel industry, or some other high-risk setting? Healthcare facilities should take care to limit the exposure of pregnant employees to patients with confirmed or suspected COVID-19, just as they would with other infectious cases. If you continue working, be sure to follow the CDC's risk assessment and infection control guidelines.  If you work in a school, travel industry, or other high-risk setting, talk with your employer about what it's doing to protect employees and minimize infection risks. Wash your hands often.   What if my OB gets COVID-19? If your doctor or midwife tests positive for COVID-19, they will need to be quarantined until they recover and are no longer at risk of transmitting the virus. In this case, you'll be assigned to another OB in your doctor's practice (or you may choose another practitioner yourself). Ask your new OB or your doctor's office if you should  self-quarantine or be tested for the virus. (It will depend on when you last saw your provider and when that person tested positive.)  Should we hold off on trying to conceive because of COVID-19? At this time, there's no reason to hold off on trying to get pregnant, but the data we have is really limited. For example, we don't think the virus causes birth defects or increases your risk of miscarriage.  But we don't know for sure whether you could transmit COVID-19 to your baby before or during delivery. We also don't know if the virus lives in semen or can be sexually transmitted.  We have a babymoon scheduled in the next few months - should we cancel? Yes. At this time, the virus has reached more than 140 countries, and there are travel bans to [place], most of [place], and [place]. Places where large numbers of people gather are at highest risk, especially airports and cruise ships.  If you were planning travel in the [place] note that any travel setting increases your risk of exposure, and there are already many places where everyone is being asked to stay home. To see how the virus is spreading, check The [place] Times map based on CDC data.  For the most current advice to help you avoid exposure, check the CDC's COVID-19 travel page.  Will the hospital separate me from my newborn and keep the baby in quarantine? If you don't have COVID-19 and have not been exposed to the virus, the hospital will not separate you from your newborn. If you do test positive for COVID-19 or have been exposed but have no symptoms, the CDC, [employer] of [employer]stetricians and [employer]necologists, and the Society for Maternal-Fetal Medicine all recommend that you be separated from your baby to decrease the risk of transmission to the baby. This would last until you are no longer at risk of transmitting the virus.  This scenario would, of course, be beyond heartbreaking. Talk to the hospital, your baby's pediatrician, and your family about how to plan for care of your baby in the event that you have to be separated after delivery. And try to make sure you have the emotional support you would need to endure the sadness and stress of having to potentially wait weeks to meet your newborn.   My hospital is restricting visitors and only allowing one support person. If my support person leaves after the delivery, will they be allowed to  come back?

## 2020-08-16 ENCOUNTER — Ambulatory Visit: Payer: Medicaid Other | Admitting: Registered"

## 2020-08-16 ENCOUNTER — Encounter: Payer: Medicaid Other | Admitting: Registered"

## 2020-08-16 VITALS — Ht 69.0 in | Wt 261.0 lb

## 2020-08-16 DIAGNOSIS — O24419 Gestational diabetes mellitus in pregnancy, unspecified control: Secondary | ICD-10-CM | POA: Diagnosis not present

## 2020-08-16 NOTE — Progress Notes (Signed)
Patient was seen on 08/16/20 for follow-up assessment and education for Gestational Diabetes. EDD 01/04/21.  Patient has started Glyburide since attending GDM class and has helped to reduce her FBS. Some of her postprandial meals continue to be elevated. Patient is [redacted]w[redacted]d and likely will need additional insulin as the pregnancy progresses. RD discussed omnipod option for insulin delivery and she expressed interest in this option.    Sample dinner meal that had negligible carbs was 121 mg/dL. Readings in the 130s was after eating soup with noodles in it.  Review of Babyscripts shows:    The following learning objectives reviewed during follow-up visit:   Appropriate amount of carbs  Omnipod insulin delivery option  Plan:   Continue carb counting   Continue with medication as prescribed  Contact Omnipod representative  Patient instructed to monitor glucose levels: FBS: 60 - 95 mg/dl 2 hour: <352 mg/dl  Patient received the following handouts: none  Patient will be seen for follow-up in as needed.

## 2020-08-16 NOTE — Patient Instructions (Signed)
Your diet sounds like you are really paying carbohydrates. Consider finding substitute for the apple juice, even though you are not drinking much it could make a difference. Your breakfast meal sounds healthy and appears to work well for your blood sugar.  Although the Glyburide is helping achieve better blood sugar, you may need insulin to help you control your blood sugar during pregnancy. You have a couple of options for how you use insulin. If you decide to use vials or pens, you will meet with a Diabetes Educator who will train you in proper injection techniques. The other options is to use a insulin delivery device called an Omnipod. This would be something you wear that takes the place of daily injections. If you are interested in this option, please follow the steps below.  1. Contact Cristal Deer, the Omnipod rep who will explain the device in detail. (see contact information below) 2. If you are interested in using this insulin delivery options, fill out paperwork provided and send it back to [employer]. 3. Vernona Rieger will contact your MD's office to get prescription for the OmniPod Dash signed.  4. After Vernona Rieger received signed Rx orders, the OmniPod device will shipped directly to you. Before it is shipped you will receive a call from [facility] company to verify your address. 5. After receive the Omnipod device, you will be contacted by an Omnipod trainer to schedule a 2-hr training session. 6. The trainer will follow-up with you for 2 weeks and make adjustments to the settings as needed.  Cristal Deer, [facility] Clinical Services Manager T/ [phone] C/ [phone] F/ [phone]

## 2020-08-23 ENCOUNTER — Other Ambulatory Visit: Payer: Self-pay

## 2020-08-30 ENCOUNTER — Other Ambulatory Visit: Payer: Self-pay | Admitting: *Deleted

## 2020-08-30 MED ORDER — OMEPRAZOLE 10 MG PO CPDR: 10.0000 mg | DELAYED_RELEASE_CAPSULE | Freq: Every day | ORAL | 2 refills | Status: DC

## 2020-08-30 NOTE — Progress Notes (Signed)
Pt called for refill on heartburn medication.  Omeprazole refilled today.

## 2020-09-06 ENCOUNTER — Ambulatory Visit: Payer: Medicaid Other | Attending: Obstetrics and Gynecology

## 2020-09-06 ENCOUNTER — Other Ambulatory Visit: Payer: Self-pay | Admitting: *Deleted

## 2020-09-06 ENCOUNTER — Other Ambulatory Visit: Payer: Self-pay

## 2020-09-06 ENCOUNTER — Ambulatory Visit: Payer: Medicaid Other | Admitting: *Deleted

## 2020-09-06 ENCOUNTER — Encounter: Payer: Self-pay | Admitting: *Deleted

## 2020-09-06 VITALS — BP 133/89 | HR 104

## 2020-09-06 DIAGNOSIS — O24415 Gestational diabetes mellitus in pregnancy, controlled by oral hypoglycemic drugs: Secondary | ICD-10-CM | POA: Diagnosis present

## 2020-09-06 DIAGNOSIS — O10919 Unspecified pre-existing hypertension complicating pregnancy, unspecified trimester: Secondary | ICD-10-CM | POA: Insufficient documentation

## 2020-09-06 DIAGNOSIS — O99512 Diseases of the respiratory system complicating pregnancy, second trimester: Secondary | ICD-10-CM

## 2020-09-06 DIAGNOSIS — O10912 Unspecified pre-existing hypertension complicating pregnancy, second trimester: Secondary | ICD-10-CM

## 2020-09-06 DIAGNOSIS — O99212 Obesity complicating pregnancy, second trimester: Secondary | ICD-10-CM | POA: Diagnosis not present

## 2020-09-06 DIAGNOSIS — Z362 Encounter for other antenatal screening follow-up: Secondary | ICD-10-CM

## 2020-09-06 DIAGNOSIS — Z3A22 22 weeks gestation of pregnancy: Secondary | ICD-10-CM

## 2020-09-06 DIAGNOSIS — J45909 Unspecified asthma, uncomplicated: Secondary | ICD-10-CM

## 2020-09-06 DIAGNOSIS — E669 Obesity, unspecified: Secondary | ICD-10-CM

## 2020-09-12 ENCOUNTER — Encounter: Payer: Self-pay | Admitting: Obstetrics and Gynecology

## 2020-09-12 ENCOUNTER — Ambulatory Visit (INDEPENDENT_AMBULATORY_CARE_PROVIDER_SITE_OTHER): Payer: Medicaid Other | Admitting: Obstetrics and Gynecology

## 2020-09-12 ENCOUNTER — Other Ambulatory Visit: Payer: Self-pay

## 2020-09-12 VITALS — BP 129/87 | HR 103 | Wt 268.0 lb

## 2020-09-12 DIAGNOSIS — O10912 Unspecified pre-existing hypertension complicating pregnancy, second trimester: Secondary | ICD-10-CM

## 2020-09-12 DIAGNOSIS — O24415 Gestational diabetes mellitus in pregnancy, controlled by oral hypoglycemic drugs: Secondary | ICD-10-CM

## 2020-09-12 DIAGNOSIS — O0990 Supervision of high risk pregnancy, unspecified, unspecified trimester: Secondary | ICD-10-CM

## 2020-09-12 MED ORDER — ASPIRIN EC 81 MG PO TBEC
81.0000 mg | DELAYED_RELEASE_TABLET | Freq: Every day | ORAL | 2 refills | Status: DC
Start: 2020-09-12 — End: 2020-12-24

## 2020-09-12 NOTE — Progress Notes (Signed)
   PRENATAL VISIT NOTE  Subjective:  Joanna Rogers is a 24 y.o. G1P0 at [redacted]w[redacted]d being seen today for ongoing prenatal care.  She is currently monitored for the following issues for this high-risk pregnancy and has Supervision of high risk pregnancy, antepartum; Chronic hypertension in obstetric context in second trimester; Asthma affecting pregnancy in second trimester; and Gestational diabetes mellitus (GDM), antepartum on their problem list.  Patient reports no complaints.  Contractions: Not present. Vag. Bleeding: None.  Movement: Present. Denies leaking of fluid.   The following portions of the patient's history were reviewed and updated as appropriate: allergies, current medications, past family history, past medical history, past social history, past surgical history and problem list.   Objective:   Vitals:   09/12/20 1311  BP: 129/87  Pulse: (!) 103  Weight: 268 lb (121.6 kg)    Fetal Status: Fetal Heart Rate (bpm): 150   Movement: Present     General:  Alert, oriented and cooperative. Patient is in no acute distress.  Skin: Skin is warm and dry. No rash noted.   Cardiovascular: Normal heart rate noted  Respiratory: Normal respiratory effort, no problems with respiration noted  Abdomen: Soft, gravid, appropriate for gestational age.  Pain/Pressure: Absent     Pelvic: Cervical exam deferred        Extremities: Normal range of motion.     Mental Status: Normal mood and affect. Normal behavior. Normal judgment and thought content.   Assessment and Plan:  Pregnancy: G1P0 at [redacted]w[redacted]d 1. Supervision of high risk pregnancy, antepartum Patient is doing well without complaints Patient vaccinated for covid-19  2. Gestational diabetes mellitus (GDM) controlled on oral hypoglycemic drug, antepartum CBGs all within range on glyburide 2.5 mg BID Patient contemplating omnipod as next intervention Baseline labs at next visit  3. Chronic hypertension in obstetric context in second  trimester Continue labetalol 200 BID Rx ASA sent again to her pharmacy Follow up growth ultrasound in October  Preterm labor symptoms and general obstetric precautions including but not limited to vaginal bleeding, contractions, leaking of fluid and fetal movement were reviewed in detail with the patient. Please refer to After Visit Summary for other counseling recommendations.   Return in about 4 weeks (around 10/10/2020) for in person, ROB, High risk.  Future Appointments  Date Time Provider Department Center  10/04/2020  2:45 PM [facility] NURSE [facility]  10/04/2020  3:00 PM [facility] US1 [facility]    Catalina Antigua, MD

## 2020-10-04 ENCOUNTER — Other Ambulatory Visit: Payer: Self-pay | Admitting: *Deleted

## 2020-10-04 ENCOUNTER — Ambulatory Visit: Payer: Medicaid Other | Admitting: *Deleted

## 2020-10-04 ENCOUNTER — Encounter: Payer: Self-pay | Admitting: *Deleted

## 2020-10-04 ENCOUNTER — Ambulatory Visit: Payer: Medicaid Other | Attending: Obstetrics and Gynecology

## 2020-10-04 ENCOUNTER — Other Ambulatory Visit: Payer: Self-pay

## 2020-10-04 VITALS — BP 124/88 | HR 95

## 2020-10-04 DIAGNOSIS — I10 Essential (primary) hypertension: Secondary | ICD-10-CM | POA: Diagnosis present

## 2020-10-04 DIAGNOSIS — O10912 Unspecified pre-existing hypertension complicating pregnancy, second trimester: Secondary | ICD-10-CM

## 2020-10-04 DIAGNOSIS — O10919 Unspecified pre-existing hypertension complicating pregnancy, unspecified trimester: Secondary | ICD-10-CM | POA: Diagnosis present

## 2020-10-04 DIAGNOSIS — O99512 Diseases of the respiratory system complicating pregnancy, second trimester: Secondary | ICD-10-CM | POA: Diagnosis not present

## 2020-10-04 DIAGNOSIS — O24415 Gestational diabetes mellitus in pregnancy, controlled by oral hypoglycemic drugs: Secondary | ICD-10-CM

## 2020-10-04 DIAGNOSIS — Z3A26 26 weeks gestation of pregnancy: Secondary | ICD-10-CM

## 2020-10-04 DIAGNOSIS — O99212 Obesity complicating pregnancy, second trimester: Secondary | ICD-10-CM | POA: Diagnosis not present

## 2020-10-04 DIAGNOSIS — E669 Obesity, unspecified: Secondary | ICD-10-CM

## 2020-10-04 DIAGNOSIS — J45909 Unspecified asthma, uncomplicated: Secondary | ICD-10-CM

## 2020-10-10 ENCOUNTER — Other Ambulatory Visit: Payer: Self-pay

## 2020-10-10 ENCOUNTER — Encounter: Payer: Self-pay | Admitting: Obstetrics and Gynecology

## 2020-10-10 ENCOUNTER — Ambulatory Visit (INDEPENDENT_AMBULATORY_CARE_PROVIDER_SITE_OTHER): Payer: Medicaid Other | Admitting: Obstetrics and Gynecology

## 2020-10-10 VITALS — BP 132/90 | HR 90 | Wt 273.0 lb

## 2020-10-10 DIAGNOSIS — O10912 Unspecified pre-existing hypertension complicating pregnancy, second trimester: Secondary | ICD-10-CM

## 2020-10-10 DIAGNOSIS — O0990 Supervision of high risk pregnancy, unspecified, unspecified trimester: Secondary | ICD-10-CM

## 2020-10-10 DIAGNOSIS — O24415 Gestational diabetes mellitus in pregnancy, controlled by oral hypoglycemic drugs: Secondary | ICD-10-CM

## 2020-10-10 MED ORDER — ALBUTEROL SULFATE HFA 108 (90 BASE) MCG/ACT IN AERS: 1.0000 | INHALATION_SPRAY | Freq: Four times a day (QID) | RESPIRATORY_TRACT | 4 refills | Status: DC | PRN

## 2020-10-10 NOTE — Addendum Note (Signed)
Addended by: Catalina Antigua on: 10/10/2020 02:00 PM   Modules accepted: Orders

## 2020-10-10 NOTE — Progress Notes (Signed)
ROB   Pt states her sugars have been doing fine.  Pt has not started baby aspirin yet. Has to get over the counter.    CC: Needs refill on Asthma Rx's.

## 2020-10-10 NOTE — Progress Notes (Signed)
   PRENATAL VISIT NOTE  Subjective:  Joanna Rogers is a 24 y.o. G1P0 at [redacted]w[redacted]d being seen today for ongoing prenatal care.  She is currently monitored for the following issues for this high-risk pregnancy and has Supervision of high risk pregnancy, antepartum; Chronic hypertension in obstetric context in second trimester; Asthma affecting pregnancy in second trimester; and Gestational diabetes mellitus (GDM), antepartum on their problem list.  Patient reports no complaints.  Contractions: Not present. Vag. Bleeding: None.  Movement: Present. Denies leaking of fluid.   The following portions of the patient's history were reviewed and updated as appropriate: allergies, current medications, past family history, past medical history, past social history, past surgical history and problem list.   Objective:   Vitals:   10/10/20 1318  BP: 132/90  Pulse: 90  Weight: 273 lb (123.8 kg)    Fetal Status: Fetal Heart Rate (bpm): 164 Fundal Height: 28 cm Movement: Present     General:  Alert, oriented and cooperative. Patient is in no acute distress.  Skin: Skin is warm and dry. No rash noted.   Cardiovascular: Normal heart rate noted  Respiratory: Normal respiratory effort, no problems with respiration noted  Abdomen: Soft, gravid, appropriate for gestational age.  Pain/Pressure: Present     Pelvic: Cervical exam deferred        Extremities: Normal range of motion.  Edema: Trace  Mental Status: Normal mood and affect. Normal behavior. Normal judgment and thought content.   Assessment and Plan:  Pregnancy: G1P0 at [redacted]w[redacted]d 1. Supervision of high risk pregnancy, antepartum Patient is doing well without complaints Third trimester labs today Patient desires pills for contraception Patient is researching pediatrician - CBC - HIV Antibody (routine testing w rflx) - RPR  2. Gestational diabetes mellitus (GDM) controlled on oral hypoglycemic drug, antepartum CBGs reviewed on babyRx and great  majority within range Continue glyburide  3. Chronic hypertension in obstetric context in second trimester BP stable on labetalol Patient plans to pick up ASA today Follow up growth ultrasound and antenatal testing 11/2  Preterm labor symptoms and general obstetric precautions including but not limited to vaginal bleeding, contractions, leaking of fluid and fetal movement were reviewed in detail with the patient. Please refer to After Visit Summary for other counseling recommendations.   Return in about 2 weeks (around 10/24/2020) for in person, ROB, High risk.  Future Appointments  Date Time Provider Department Center  11/01/2020  3:00 PM [facility] NURSE [facility]  11/01/2020  3:15 PM [facility] US2 [facility]  11/08/2020  2:30 PM [facility]URSE [facility]  11/08/2020  2:45 PM [facility] US4 [facility]    Catalina Antigua, MD

## 2020-10-11 LAB — CBC
Hematocrit: 37.5 % (ref 34.0–46.6)
Hemoglobin: 12.1 g/dL (ref 11.1–15.9)
MCH: 27.5 pg (ref 26.6–33.0)
MCHC: 32.3 g/dL (ref 31.5–35.7)
MCV: 85 fL (ref 79–97)
Platelets: 249 10*3/uL (ref 150–450)
RBC: 4.4 x10E6/uL (ref 3.77–5.28)
RDW: 13.7 % (ref 11.7–15.4)
WBC: 10.3 10*3/uL (ref 3.4–10.8)

## 2020-10-11 LAB — HIV ANTIBODY (ROUTINE TESTING W REFLEX): HIV Screen 4th Generation wRfx: NONREACTIVE

## 2020-10-11 LAB — RPR: RPR Ser Ql: NONREACTIVE

## 2020-10-31 ENCOUNTER — Ambulatory Visit (INDEPENDENT_AMBULATORY_CARE_PROVIDER_SITE_OTHER): Payer: Medicaid Other | Admitting: Obstetrics & Gynecology

## 2020-10-31 ENCOUNTER — Other Ambulatory Visit: Payer: Self-pay

## 2020-10-31 VITALS — BP 142/99 | HR 102 | Wt 277.0 lb

## 2020-10-31 DIAGNOSIS — O24415 Gestational diabetes mellitus in pregnancy, controlled by oral hypoglycemic drugs: Secondary | ICD-10-CM

## 2020-10-31 DIAGNOSIS — O10912 Unspecified pre-existing hypertension complicating pregnancy, second trimester: Secondary | ICD-10-CM

## 2020-10-31 DIAGNOSIS — O0990 Supervision of high risk pregnancy, unspecified, unspecified trimester: Secondary | ICD-10-CM

## 2020-10-31 MED ORDER — LABETALOL HCL 200 MG PO TABS: 200.0000 mg | ORAL_TABLET | Freq: Two times a day (BID) | ORAL | 3 refills | Status: DC

## 2020-10-31 NOTE — Patient Instructions (Signed)
° ° ° °  If you have lab work done today you will be contacted with your lab results within the next 2 weeks.  If you have not heard from us then please contact us. The fastest way to get your results is to register for My Chart. ° ° °IF you received an x-ray today, you will receive an invoice from [facility]. Please contact [facility] at [phone] with questions or concerns regarding your invoice.  ° °IF you received labwork today, you will receive an invoice from [facility]. Please contact [facility] at [phone] with questions or concerns regarding your invoice.  ° °Our billing staff will not be able to assist you with questions regarding bills from these companies. ° °You will be contacted with the lab results as soon as they are available. The fastest way to get your results is to activate your My Chart account. Instructions are located on the last page of this paperwork. If you have not heard from us regarding the results in 2 weeks, please contact this office. °  ° ° ° °

## 2020-10-31 NOTE — Progress Notes (Signed)
Pt repeat diabetes 142/99

## 2020-10-31 NOTE — Progress Notes (Signed)
° °  PRENATAL VISIT NOTE  Subjective:  Joanna Rogers is a 24 y.o. G1P0 at [redacted]w[redacted]d being seen today for ongoing prenatal care.  She is currently monitored for the following issues for this high-risk pregnancy and has Supervision of high risk pregnancy, antepartum; Chronic hypertension in obstetric context in second trimester; Asthma affecting pregnancy in second trimester; and Gestational diabetes mellitus (GDM), antepartum on their problem list.  Patient reports no complaints.  Contractions: Not present. Vag. Bleeding: None.  Movement: Present. Denies leaking of fluid.   The following portions of the patient's history were reviewed and updated as appropriate: allergies, current medications, past family history, past medical history, past social history, past surgical history and problem list.   Objective:   Vitals:   10/31/20 1443 10/31/20 1458  BP: (!) 145/96 (!) 142/99  Pulse: (!) 102   Weight: 277 lb (125.6 kg)     Fetal Status: Fetal Heart Rate (bpm): 147   Movement: Present     General:  Alert, oriented and cooperative. Patient is in no acute distress.  Skin: Skin is warm and dry. No rash noted.   Cardiovascular: Normal heart rate noted  Respiratory: Normal respiratory effort, no problems with respiration noted  Abdomen: Soft, gravid, appropriate for gestational age.  Pain/Pressure: Present     Pelvic: Cervical exam deferred        Extremities: Normal range of motion.  Edema: Trace  Mental Status: Normal mood and affect. Normal behavior. Normal judgment and thought content.   Assessment and Plan:  Pregnancy: G1P0 at [redacted]w[redacted]d 1. Gestational diabetes mellitus (GDM) controlled on oral hypoglycemic drug, antepartum FBS <80 and PP <120, continue present management  2. Chronic hypertension in obstetric context in second trimester F/u growth [place] soon  3. Supervision of high risk pregnancy, antepartum BP noted to be elevated, increase her dose to 300 BID - labetalol (NORMODYNE) 200 MG  tablet; Take 1 tablet (200 mg total) by mouth 2 (two) times daily.  Dispense: 60 tablet; Refill: 3  Preterm labor symptoms and general obstetric precautions including but not limited to vaginal bleeding, contractions, leaking of fluid and fetal movement were reviewed in detail with the patient. Please refer to After Visit Summary for other counseling recommendations.   Return in about 2 weeks (around 11/14/2020).  Future Appointments  Date Time Provider Department Center  11/01/2020  3:00 PM [facility] NURSE [facility]  11/01/2020  3:15 PM [facility]2 [facility]  11/08/2020  2:30 PM [facility] NURSE [facility]  11/08/2020  2:45 PM [facility]4 [facility]    Scheryl Darter, MD

## 2020-11-01 ENCOUNTER — Ambulatory Visit: Payer: Medicaid Other | Admitting: *Deleted

## 2020-11-01 ENCOUNTER — Ambulatory Visit: Payer: Medicaid Other | Attending: Obstetrics and Gynecology

## 2020-11-01 ENCOUNTER — Encounter: Payer: Self-pay | Admitting: *Deleted

## 2020-11-01 VITALS — BP 135/83 | HR 101

## 2020-11-01 DIAGNOSIS — O99513 Diseases of the respiratory system complicating pregnancy, third trimester: Secondary | ICD-10-CM

## 2020-11-01 DIAGNOSIS — O99213 Obesity complicating pregnancy, third trimester: Secondary | ICD-10-CM

## 2020-11-01 DIAGNOSIS — O10919 Unspecified pre-existing hypertension complicating pregnancy, unspecified trimester: Secondary | ICD-10-CM | POA: Diagnosis present

## 2020-11-01 DIAGNOSIS — Z362 Encounter for other antenatal screening follow-up: Secondary | ICD-10-CM

## 2020-11-01 DIAGNOSIS — Z3A30 30 weeks gestation of pregnancy: Secondary | ICD-10-CM

## 2020-11-01 DIAGNOSIS — O10913 Unspecified pre-existing hypertension complicating pregnancy, third trimester: Secondary | ICD-10-CM | POA: Diagnosis not present

## 2020-11-01 DIAGNOSIS — O24415 Gestational diabetes mellitus in pregnancy, controlled by oral hypoglycemic drugs: Secondary | ICD-10-CM

## 2020-11-02 ENCOUNTER — Other Ambulatory Visit: Payer: Self-pay | Admitting: *Deleted

## 2020-11-02 DIAGNOSIS — O10919 Unspecified pre-existing hypertension complicating pregnancy, unspecified trimester: Secondary | ICD-10-CM

## 2020-11-08 ENCOUNTER — Ambulatory Visit: Payer: Medicaid Other | Attending: Obstetrics and Gynecology

## 2020-11-08 ENCOUNTER — Ambulatory Visit: Payer: Medicaid Other | Admitting: *Deleted

## 2020-11-08 ENCOUNTER — Encounter: Payer: Self-pay | Admitting: *Deleted

## 2020-11-08 ENCOUNTER — Other Ambulatory Visit: Payer: Self-pay

## 2020-11-08 VITALS — BP 133/83 | HR 99

## 2020-11-08 DIAGNOSIS — O99213 Obesity complicating pregnancy, third trimester: Secondary | ICD-10-CM | POA: Diagnosis not present

## 2020-11-08 DIAGNOSIS — O10913 Unspecified pre-existing hypertension complicating pregnancy, third trimester: Secondary | ICD-10-CM

## 2020-11-08 DIAGNOSIS — J45909 Unspecified asthma, uncomplicated: Secondary | ICD-10-CM

## 2020-11-08 DIAGNOSIS — Z362 Encounter for other antenatal screening follow-up: Secondary | ICD-10-CM

## 2020-11-08 DIAGNOSIS — O99513 Diseases of the respiratory system complicating pregnancy, third trimester: Secondary | ICD-10-CM

## 2020-11-08 DIAGNOSIS — E669 Obesity, unspecified: Secondary | ICD-10-CM

## 2020-11-08 DIAGNOSIS — O10919 Unspecified pre-existing hypertension complicating pregnancy, unspecified trimester: Secondary | ICD-10-CM | POA: Diagnosis present

## 2020-11-08 DIAGNOSIS — O24415 Gestational diabetes mellitus in pregnancy, controlled by oral hypoglycemic drugs: Secondary | ICD-10-CM

## 2020-11-08 DIAGNOSIS — Z3A31 31 weeks gestation of pregnancy: Secondary | ICD-10-CM

## 2020-11-14 ENCOUNTER — Other Ambulatory Visit: Payer: Self-pay

## 2020-11-14 ENCOUNTER — Ambulatory Visit (INDEPENDENT_AMBULATORY_CARE_PROVIDER_SITE_OTHER): Payer: Medicaid Other | Admitting: Advanced Practice Midwife

## 2020-11-14 VITALS — BP 138/94 | HR 88 | Wt 278.0 lb

## 2020-11-14 DIAGNOSIS — Z3A32 32 weeks gestation of pregnancy: Secondary | ICD-10-CM

## 2020-11-14 DIAGNOSIS — O0990 Supervision of high risk pregnancy, unspecified, unspecified trimester: Secondary | ICD-10-CM

## 2020-11-14 DIAGNOSIS — O24415 Gestational diabetes mellitus in pregnancy, controlled by oral hypoglycemic drugs: Secondary | ICD-10-CM

## 2020-11-14 DIAGNOSIS — O99891 Other specified diseases and conditions complicating pregnancy: Secondary | ICD-10-CM

## 2020-11-14 DIAGNOSIS — M549 Dorsalgia, unspecified: Secondary | ICD-10-CM

## 2020-11-14 DIAGNOSIS — O10912 Unspecified pre-existing hypertension complicating pregnancy, second trimester: Secondary | ICD-10-CM

## 2020-11-14 MED ORDER — LABETALOL HCL 200 MG PO TABS: 300.0000 mg | ORAL_TABLET | Freq: Two times a day (BID) | ORAL | 3 refills | Status: DC

## 2020-11-14 MED ORDER — COMFORT FIT MATERNITY SUPP LG MISC: 1.0000 | Freq: Once | 0 refills | Status: AC

## 2020-11-14 NOTE — Progress Notes (Signed)
  Pt is having increase in pelvic pressure and low back pain.  Pt does not have maternity support belt.

## 2020-11-14 NOTE — Progress Notes (Signed)
   PRENATAL VISIT NOTE  Subjective:  Joanna Rogers is a 24 y.o. G1P0 at [redacted]w[redacted]d being seen today for ongoing prenatal care.  She is currently monitored for the following issues for this high-risk pregnancy and has Supervision of high risk pregnancy, antepartum; Chronic hypertension in obstetric context in second trimester; Asthma affecting pregnancy in second trimester; and Gestational diabetes mellitus (GDM), antepartum on their problem list.  Patient reports back pain when walking.  Contractions: Not present. Vag. Bleeding: None.  Movement: Present. Denies leaking of fluid.   The following portions of the patient's history were reviewed and updated as appropriate: allergies, current medications, past family history, past medical history, past social history, past surgical history and problem list.   Objective:   Vitals:   11/14/20 1543  BP: (!) 138/94  Pulse: 88  Weight: 278 lb (126.1 kg)    Fetal Status: Fetal Heart Rate (bpm): 155   Movement: Present     General:  Alert, oriented and cooperative. Patient is in no acute distress.  Skin: Skin is warm and dry. No rash noted.   Cardiovascular: Normal heart rate noted  Respiratory: Normal respiratory effort, no problems with respiration noted  Abdomen: Soft, gravid, appropriate for gestational age.  Pain/Pressure: Present     Pelvic: Cervical exam deferred        Extremities: Normal range of motion.     Mental Status: Normal mood and affect. Normal behavior. Normal judgment and thought content.   Assessment and Plan:  Pregnancy: G1P0 at [redacted]w[redacted]d 1. Supervision of high risk pregnancy, antepartum --Anticipatory guidance about next visits/weeks of pregnancy given. --next visit in 2 weeks in office  2. Back pain affecting pregnancy in third trimester --No regular contractions, pain when pt walking the mall with her husband --Rest/ice/heat/warm bath/Tylenol/pregnancy support belt  - Elastic Bandages & Supports (COMFORT FIT MATERNITY  SUPP LG) MISC; 1 each by Does not apply route once for 1 dose.  Dispense: 1 each; Refill: 0 - POC Urinalysis Dipstick OB  3. [redacted] weeks gestation of pregnancy   4. Chronic hypertension in obstetric context in second trimester --On labetalol 200 mg BID, BP 130s/90s today, most BP 130s/80s in last few weeks.   --Increase labetalol to 300 BID, as discussed with pt and previous provider.  Pt to continue weekly BP at home. --No s/sx of PEC  5. Gestational diabetes mellitus (GDM) controlled on oral hypoglycemic drug, antepartum --Reviewed glucose log. Fastings all below 95, PP only 1 value in 2 weeks above 120 --Keep taking Glyburide as prescribed  Preterm labor symptoms and general obstetric precautions including but not limited to vaginal bleeding, contractions, leaking of fluid and fetal movement were reviewed in detail with the patient. Please refer to After Visit Summary for other counseling recommendations.   No follow-ups on file.  Future Appointments  Date Time Provider Department Center  11/16/2020  1:30 PM [facility] NURSE [facility]  11/16/2020  1:45 PM [facility] US4 [facility]  11/22/2020  2:00 PM [facility]SE [facility]  11/22/2020  2:15 PM [facility]2 [facility]  12/02/2020  3:30 PM [facility]SE [facility]  12/02/2020  3:45 PM [facility] US1 [facility]  12/08/2020  3:30 PM [facility]SE [facility]  12/08/2020  3:45 PM [facility]1 [facility]    Sharen Counter, CNM

## 2020-11-16 ENCOUNTER — Ambulatory Visit: Payer: Medicaid Other | Attending: Obstetrics and Gynecology

## 2020-11-16 ENCOUNTER — Other Ambulatory Visit: Payer: Self-pay

## 2020-11-16 ENCOUNTER — Encounter: Payer: Self-pay | Admitting: *Deleted

## 2020-11-16 ENCOUNTER — Ambulatory Visit: Payer: Medicaid Other | Admitting: *Deleted

## 2020-11-16 VITALS — BP 134/82 | HR 96

## 2020-11-16 DIAGNOSIS — O10919 Unspecified pre-existing hypertension complicating pregnancy, unspecified trimester: Secondary | ICD-10-CM | POA: Diagnosis not present

## 2020-11-16 DIAGNOSIS — Z362 Encounter for other antenatal screening follow-up: Secondary | ICD-10-CM

## 2020-11-16 DIAGNOSIS — E669 Obesity, unspecified: Secondary | ICD-10-CM

## 2020-11-16 DIAGNOSIS — O10913 Unspecified pre-existing hypertension complicating pregnancy, third trimester: Secondary | ICD-10-CM | POA: Insufficient documentation

## 2020-11-16 DIAGNOSIS — Z3A33 33 weeks gestation of pregnancy: Secondary | ICD-10-CM

## 2020-11-16 DIAGNOSIS — O99213 Obesity complicating pregnancy, third trimester: Secondary | ICD-10-CM | POA: Diagnosis not present

## 2020-11-16 DIAGNOSIS — J45909 Unspecified asthma, uncomplicated: Secondary | ICD-10-CM

## 2020-11-16 DIAGNOSIS — O10013 Pre-existing essential hypertension complicating pregnancy, third trimester: Secondary | ICD-10-CM

## 2020-11-16 DIAGNOSIS — O24415 Gestational diabetes mellitus in pregnancy, controlled by oral hypoglycemic drugs: Secondary | ICD-10-CM | POA: Diagnosis not present

## 2020-11-16 DIAGNOSIS — O99513 Diseases of the respiratory system complicating pregnancy, third trimester: Secondary | ICD-10-CM

## 2020-11-22 ENCOUNTER — Ambulatory Visit: Payer: Medicaid Other | Admitting: *Deleted

## 2020-11-22 ENCOUNTER — Ambulatory Visit: Payer: Medicaid Other | Attending: Obstetrics and Gynecology

## 2020-11-22 ENCOUNTER — Other Ambulatory Visit: Payer: Self-pay

## 2020-11-22 ENCOUNTER — Encounter: Payer: Self-pay | Admitting: *Deleted

## 2020-11-22 ENCOUNTER — Other Ambulatory Visit: Payer: Self-pay | Admitting: Obstetrics and Gynecology

## 2020-11-22 VITALS — BP 138/79 | HR 89

## 2020-11-22 DIAGNOSIS — O10919 Unspecified pre-existing hypertension complicating pregnancy, unspecified trimester: Secondary | ICD-10-CM | POA: Diagnosis not present

## 2020-11-22 DIAGNOSIS — O99513 Diseases of the respiratory system complicating pregnancy, third trimester: Secondary | ICD-10-CM | POA: Diagnosis not present

## 2020-11-22 DIAGNOSIS — O10913 Unspecified pre-existing hypertension complicating pregnancy, third trimester: Secondary | ICD-10-CM

## 2020-11-22 DIAGNOSIS — O24415 Gestational diabetes mellitus in pregnancy, controlled by oral hypoglycemic drugs: Secondary | ICD-10-CM | POA: Diagnosis not present

## 2020-11-22 DIAGNOSIS — J45909 Unspecified asthma, uncomplicated: Secondary | ICD-10-CM

## 2020-11-22 DIAGNOSIS — Z3A33 33 weeks gestation of pregnancy: Secondary | ICD-10-CM

## 2020-11-22 DIAGNOSIS — O24419 Gestational diabetes mellitus in pregnancy, unspecified control: Secondary | ICD-10-CM

## 2020-11-22 DIAGNOSIS — E669 Obesity, unspecified: Secondary | ICD-10-CM

## 2020-11-22 DIAGNOSIS — O99212 Obesity complicating pregnancy, second trimester: Secondary | ICD-10-CM | POA: Diagnosis not present

## 2020-11-28 ENCOUNTER — Other Ambulatory Visit: Payer: Self-pay

## 2020-11-28 ENCOUNTER — Ambulatory Visit (INDEPENDENT_AMBULATORY_CARE_PROVIDER_SITE_OTHER): Payer: Medicaid Other | Admitting: Obstetrics & Gynecology

## 2020-11-28 VITALS — BP 128/92 | HR 91 | Wt 284.0 lb

## 2020-11-28 DIAGNOSIS — O24415 Gestational diabetes mellitus in pregnancy, controlled by oral hypoglycemic drugs: Secondary | ICD-10-CM

## 2020-11-28 DIAGNOSIS — O0990 Supervision of high risk pregnancy, unspecified, unspecified trimester: Secondary | ICD-10-CM

## 2020-11-28 NOTE — Progress Notes (Signed)
ROB, reports no problems today. Fasting 60-80  After meals 90-110.

## 2020-11-29 NOTE — Patient Instructions (Signed)
Third Trimester of Pregnancy The third trimester is from week 28 through week 40 (months 7 through 9). The third trimester is a time when the unborn baby (fetus) is growing rapidly. At the end of the ninth month, the fetus is about 20 inches in length and weighs 6-10 pounds. Body changes during your third trimester Your body will continue to go through many changes during pregnancy. The changes vary from woman to woman. During the third trimester:  Your weight will continue to increase. You can expect to gain 25-35 pounds (11-16 kg) by the end of the pregnancy.  You may begin to get stretch marks on your hips, abdomen, and breasts.  You may urinate more often because the fetus is moving lower into your pelvis and pressing on your bladder.  You may develop or continue to have heartburn. This is caused by increased hormones that slow down muscles in the digestive tract.  You may develop or continue to have constipation because increased hormones slow digestion and cause the muscles that push waste through your intestines to relax.  You may develop hemorrhoids. These are swollen veins (varicose veins) in the rectum that can itch or be painful.  You may develop swollen, bulging veins (varicose veins) in your legs.  You may have increased body aches in the pelvis, back, or thighs. This is due to weight gain and increased hormones that are relaxing your joints.  You may have changes in your hair. These can include thickening of your hair, rapid growth, and changes in texture. Some women also have hair loss during or after pregnancy, or hair that feels dry or thin. Your hair will most likely return to normal after your baby is born.  Your breasts will continue to grow and they will continue to become tender. A yellow fluid (colostrum) may leak from your breasts. This is the first milk you are producing for your baby.  Your belly button may stick out.  You may notice more swelling in your hands,  face, or ankles.  You may have increased tingling or numbness in your hands, arms, and legs. The skin on your belly may also feel numb.  You may feel short of breath because of your expanding uterus.  You may have more problems sleeping. This can be caused by the size of your belly, increased need to urinate, and an increase in your body's metabolism.  You may notice the fetus "dropping," or moving lower in your abdomen (lightening).  You may have increased vaginal discharge.  You may notice your joints feel loose and you may have pain around your pelvic bone. What to expect at prenatal visits You will have prenatal exams every 2 weeks until week 36. Then you will have weekly prenatal exams. During a routine prenatal visit:  You will be weighed to make sure you and the baby are growing normally.  Your blood pressure will be taken.  Your abdomen will be measured to track your baby's growth.  The fetal heartbeat will be listened to.  Any test results from the previous visit will be discussed.  You may have a cervical check near your due date to see if your cervix has softened or thinned (effaced).  You will be tested for Group B streptococcus. This happens between 35 and 37 weeks. Your health care provider may ask you:  What your birth plan is.  How you are feeling.  If you are feeling the baby move.  If you have had any abnormal   symptoms, such as leaking fluid, bleeding, severe headaches, or abdominal cramping.  If you are using any tobacco products, including cigarettes, chewing tobacco, and electronic cigarettes.  If you have any questions. Other tests or screenings that may be performed during your third trimester include:  Blood tests that check for low iron levels (anemia).  Fetal testing to check the health, activity level, and growth of the fetus. Testing is done if you have certain medical conditions or if there are problems during the pregnancy.  Nonstress test  (NST). This test checks the health of your baby to make sure there are no signs of problems, such as the baby not getting enough oxygen. During this test, a belt is placed around your belly. The baby is made to move, and its heart rate is monitored during movement. What is false labor? False labor is a condition in which you feel small, irregular tightenings of the muscles in the womb (contractions) that usually go away with rest, changing position, or drinking water. These are called Braxton Hicks contractions. Contractions may last for hours, days, or even weeks before true labor sets in. If contractions come at regular intervals, become more frequent, increase in intensity, or become painful, you should see your health care provider. What are the signs of labor?  Abdominal cramps.  Regular contractions that start at 10 minutes apart and become stronger and more frequent with time.  Contractions that start on the top of the uterus and spread down to the lower abdomen and back.  Increased pelvic pressure and dull back pain.  A watery or bloody mucus discharge that comes from the vagina.  Leaking of amniotic fluid. This is also known as your "water breaking." It could be a slow trickle or a gush. Let your health care provider know if it has a color or strange odor. If you have any of these signs, call your health care provider right away, even if it is before your due date. Follow these instructions at home: Medicines  Follow your health care provider's instructions regarding medicine use. Specific medicines may be either safe or unsafe to take during pregnancy.  Take a prenatal vitamin that contains at least 600 micrograms (mcg) of folic acid.  If you develop constipation, try taking a stool softener if your health care provider approves. Eating and drinking   Eat a balanced diet that includes fresh fruits and vegetables, whole grains, good sources of protein such as meat, eggs, or tofu,  and low-fat dairy. Your health care provider will help you determine the amount of weight gain that is right for you.  Avoid raw meat and uncooked cheese. These carry germs that can cause birth defects in the baby.  If you have low calcium intake from food, talk to your health care provider about whether you should take a daily calcium supplement.  Eat four or five small meals rather than three large meals a day.  Limit foods that are high in fat and processed sugars, such as fried and sweet foods.  To prevent constipation: ? Drink enough fluid to keep your urine clear or pale yellow. ? Eat foods that are high in fiber, such as fresh fruits and vegetables, whole grains, and beans. Activity  Exercise only as directed by your health care provider. Most women can continue their usual exercise routine during pregnancy. Try to exercise for 30 minutes at least 5 days a week. Stop exercising if you experience uterine contractions.  Avoid heavy lifting.  Do   not exercise in extreme heat or humidity, or at high altitudes.  Wear low-heel, comfortable shoes.  Practice good posture.  You may continue to have sex unless your health care provider tells you otherwise. Relieving pain and discomfort  Take frequent breaks and rest with your legs elevated if you have leg cramps or low back pain.  Take warm sitz baths to soothe any pain or discomfort caused by hemorrhoids. Use hemorrhoid cream if your health care provider approves.  Wear a good support bra to prevent discomfort from breast tenderness.  If you develop varicose veins: ? Wear support pantyhose or compression stockings as told by your healthcare provider. ? Elevate your feet for 15 minutes, 3-4 times a day. Prenatal care  Write down your questions. Take them to your prenatal visits.  Keep all your prenatal visits as told by your health care provider. This is important. Safety  Wear your seat belt at all times when driving.  Make  a list of emergency phone numbers, including numbers for family, friends, the hospital, and police and fire departments. General instructions  Avoid cat litter boxes and soil used by cats. These carry germs that can cause birth defects in the baby. If you have a cat, ask someone to clean the litter box for you.  Do not travel far distances unless it is absolutely necessary and only with the approval of your health care provider.  Do not use hot tubs, steam rooms, or saunas.  Do not drink alcohol.  Do not use any products that contain nicotine or tobacco, such as cigarettes and e-cigarettes. If you need help quitting, ask your health care provider.  Do not use any medicinal herbs or unprescribed drugs. These chemicals affect the formation and growth of the baby.  Do not douche or use tampons or scented sanitary pads.  Do not cross your legs for long periods of time.  To prepare for the arrival of your baby: ? Take prenatal classes to understand, practice, and ask questions about labor and delivery. ? Make a trial run to the hospital. ? Visit the hospital and tour the maternity area. ? Arrange for maternity or paternity leave through employers. ? Arrange for family and friends to take care of pets while you are in the hospital. ? Purchase a rear-facing car seat and make sure you know how to install it in your car. ? Pack your hospital bag. ? Prepare the baby's nursery. Make sure to remove all pillows and stuffed animals from the baby's crib to prevent suffocation.  Visit your dentist if you have not gone during your pregnancy. Use a soft toothbrush to brush your teeth and be gentle when you floss. Contact a health care provider if:  You are unsure if you are in labor or if your water has broken.  You become dizzy.  You have mild pelvic cramps, pelvic pressure, or nagging pain in your abdominal area.  You have lower back pain.  You have persistent nausea, vomiting, or  diarrhea.  You have an unusual or bad smelling vaginal discharge.  You have pain when you urinate. Get help right away if:  Your water breaks before 37 weeks.  You have regular contractions less than 5 minutes apart before 37 weeks.  You have a fever.  You are leaking fluid from your vagina.  You have spotting or bleeding from your vagina.  You have severe abdominal pain or cramping.  You have rapid weight loss or weight gain.  You have   shortness of breath with chest pain.  You notice sudden or extreme swelling of your face, hands, ankles, feet, or legs.  Your baby makes fewer than 10 movements in 2 hours.  You have severe headaches that do not go away when you take medicine.  You have vision changes. Summary  The third trimester is from week 28 through week 40, months 7 through 9. The third trimester is a time when the unborn baby (fetus) is growing rapidly.  During the third trimester, your discomfort may increase as you and your baby continue to gain weight. You may have abdominal, leg, and back pain, sleeping problems, and an increased need to urinate.  During the third trimester your breasts will keep growing and they will continue to become tender. A yellow fluid (colostrum) may leak from your breasts. This is the first milk you are producing for your baby.  False labor is a condition in which you feel small, irregular tightenings of the muscles in the womb (contractions) that eventually go away. These are called Braxton Hicks contractions. Contractions may last for hours, days, or even weeks before true labor sets in.  Signs of labor can include: abdominal cramps; regular contractions that start at 10 minutes apart and become stronger and more frequent with time; watery or bloody mucus discharge that comes from the vagina; increased pelvic pressure and dull back pain; and leaking of amniotic fluid. This information is not intended to replace advice given to you by your  health care provider. Make sure you discuss any questions you have with your health care provider. Document Revised: 04/09/2019 Document Reviewed: 01/22/2017 Elsevier Patient Education  2020 [employer]  

## 2020-11-29 NOTE — Progress Notes (Signed)
Patient ID: Joanna Rogers, female   DOB: 1996/04/08, 24 y.o.   MRN: [id number]    PRENATAL VISIT NOTE  Subjective:  Joanna Rogers is a 24 y.o. G1P0 at [redacted]w[redacted]d being seen today for ongoing prenatal care.  She is currently monitored for the following issues for this high-risk pregnancy and has Supervision of high risk pregnancy, antepartum; Chronic hypertension in obstetric context in second trimester; Asthma affecting pregnancy in second trimester; and Gestational diabetes mellitus (GDM), antepartum on their problem list.  Patient reports no bleeding, no contractions, no cramping and no leaking.  Contractions: Not present. Vag. Bleeding: None.  Movement: Present. Denies leaking of fluid.   The following portions of the patient's history were reviewed and updated as appropriate: allergies, current medications, past family history, past medical history, past social history, past surgical history and problem list.   Objective:   Vitals:   11/28/20 1618  BP: (!) 128/92  Pulse: 91  Weight: 284 lb (128.8 kg)    Fetal Status: Fetal Heart Rate (bpm): 148   Movement: Present     General:  Alert, oriented and cooperative. Patient is in no acute distress.  Skin: Skin is warm and dry. No rash noted.   Cardiovascular: Normal heart rate noted  Respiratory: Normal respiratory effort, no problems with respiration noted  Abdomen: Soft, gravid, appropriate for gestational age.  Pain/Pressure: Present     Pelvic: Cervical exam deferred        Extremities: Normal range of motion.  Edema: None  Mental Status: Normal mood and affect. Normal behavior. Normal judgment and thought content.   Assessment and Plan:  Pregnancy: G1P0 at [redacted]w[redacted]d 1. Supervision of high risk pregnancy, antepartum Will schedule Cultures next week  2. Gestational diabetes mellitus (GDM) controlled on oral hypoglycemic drug, antepartum Glucose log reviewed well controlled. No change in regimen, currently on glyburide  Preterm labor  symptoms and general obstetric precautions including but not limited to vaginal bleeding, contractions, leaking of fluid and fetal movement were reviewed in detail with the patient. Please refer to After Visit Summary for other counseling recommendations.   Return in 1 week (on 12/05/2020).  Future Appointments  Date Time Provider Department Center  12/02/2020  3:30 PM [facility] NURSE [facility]  12/02/2020  3:45 PM [facility]1 [facility]  12/05/2020  3:15 PM Warden Fillers, MD [facility] None  12/08/2020  3:30 PM [facility]SE [facility]  12/08/2020  3:45 PM [facility]1 [facility]    Malachy Chamber, MD

## 2020-12-02 ENCOUNTER — Other Ambulatory Visit: Payer: Self-pay

## 2020-12-02 ENCOUNTER — Encounter: Payer: Self-pay | Admitting: *Deleted

## 2020-12-02 ENCOUNTER — Ambulatory Visit: Payer: Medicaid Other | Attending: Obstetrics and Gynecology

## 2020-12-02 ENCOUNTER — Ambulatory Visit: Payer: Medicaid Other | Admitting: *Deleted

## 2020-12-02 VITALS — BP 125/71 | HR 84

## 2020-12-02 DIAGNOSIS — E669 Obesity, unspecified: Secondary | ICD-10-CM

## 2020-12-02 DIAGNOSIS — O24415 Gestational diabetes mellitus in pregnancy, controlled by oral hypoglycemic drugs: Secondary | ICD-10-CM

## 2020-12-02 DIAGNOSIS — O99513 Diseases of the respiratory system complicating pregnancy, third trimester: Secondary | ICD-10-CM

## 2020-12-02 DIAGNOSIS — J45909 Unspecified asthma, uncomplicated: Secondary | ICD-10-CM

## 2020-12-02 DIAGNOSIS — Z3A35 35 weeks gestation of pregnancy: Secondary | ICD-10-CM

## 2020-12-02 DIAGNOSIS — O10913 Unspecified pre-existing hypertension complicating pregnancy, third trimester: Secondary | ICD-10-CM | POA: Diagnosis present

## 2020-12-02 DIAGNOSIS — O10919 Unspecified pre-existing hypertension complicating pregnancy, unspecified trimester: Secondary | ICD-10-CM | POA: Diagnosis not present

## 2020-12-02 DIAGNOSIS — O99213 Obesity complicating pregnancy, third trimester: Secondary | ICD-10-CM | POA: Diagnosis not present

## 2020-12-05 ENCOUNTER — Ambulatory Visit (INDEPENDENT_AMBULATORY_CARE_PROVIDER_SITE_OTHER): Payer: Medicaid Other | Admitting: Obstetrics and Gynecology

## 2020-12-05 ENCOUNTER — Other Ambulatory Visit: Payer: Self-pay

## 2020-12-05 ENCOUNTER — Other Ambulatory Visit: Payer: Self-pay | Admitting: *Deleted

## 2020-12-05 VITALS — BP 126/91 | HR 90 | Temp 98.0°F | Wt 282.5 lb

## 2020-12-05 DIAGNOSIS — O0990 Supervision of high risk pregnancy, unspecified, unspecified trimester: Secondary | ICD-10-CM

## 2020-12-05 DIAGNOSIS — O10919 Unspecified pre-existing hypertension complicating pregnancy, unspecified trimester: Secondary | ICD-10-CM

## 2020-12-05 DIAGNOSIS — O24419 Gestational diabetes mellitus in pregnancy, unspecified control: Secondary | ICD-10-CM

## 2020-12-05 DIAGNOSIS — O24415 Gestational diabetes mellitus in pregnancy, controlled by oral hypoglycemic drugs: Secondary | ICD-10-CM

## 2020-12-05 DIAGNOSIS — O10912 Unspecified pre-existing hypertension complicating pregnancy, second trimester: Secondary | ICD-10-CM

## 2020-12-05 MED ORDER — GLYBURIDE 2.5 MG PO TABS: 2.5000 mg | ORAL_TABLET | Freq: Two times a day (BID) | ORAL | 3 refills | Status: DC

## 2020-12-05 NOTE — Patient Instructions (Signed)
Gestational Diabetes Mellitus, Self Care When you have gestational diabetes (gestational diabetes mellitus), you must make sure your blood sugar (glucose) stays in a healthy range. You can do this with:  Nutrition.  Exercise.  Lifestyle changes.  Medicines or insulin, if needed.  Support from your doctors and others. If you get treated for this condition, it may not hurt you or your unborn baby (fetus). If you do not get treated for this condition, it may cause problems that can hurt you or your unborn baby. If you get gestational diabetes, you are:  More likely to get it if you get pregnant again.  More likely to develop type 2 diabetes in the future. How to stay aware of blood sugar   Check your blood sugar every day while you are pregnant. Check it as often as told.  Call your doctor if your blood sugar is above your goal numbers for two tests in a row. Your doctor will set personal treatment goals for you. Generally, you should have these blood sugar levels:  Before meals, or after not eating for a long time (fasting or preprandial): at or below 95 mg/dL (5.3 mmol/L).  After meals (postprandial): ? One hour after a meal: at or below 140 mg/dL (7.8 mmol/L). ? Two hours after a meal: at or below 120 mg/dL (6.7 mmol/L).  A1c (hemoglobin A1c) level: 6-6.5%. How to manage high and low blood sugar Signs of high blood sugar High blood sugar is called hyperglycemia. Know the early signs of high blood sugar. Signs may include:  Feeling: ? Thirsty. ? Hungry. ? Very tired.  Needing to pee (urinate) more than usual.  Blurry vision. Signs of low blood sugar Low blood sugar is called hypoglycemia. This is when blood sugar is at or below 70 mg/dL (3.9 mmol/L). Signs may include:  Feeling: ? Hungry. ? Worried or nervous (anxious). ? Sweaty and clammy. ? Confused. ? Dizzy. ? Sleepy. ? Sick to your stomach (nauseous).  Having: ? A fast heartbeat. ? A headache. ? A change  in your vision. ? Tingling or no feeling (numbness) around your mouth, lips, or tongue. ? Jerky movements that you cannot control (seizure).  Having trouble with: ? Moving (coordination). ? Sleeping. ? Passing out (fainting). ? Getting upset easily (irritability). Treating low blood sugar To treat low blood sugar, eat or drink something sugary right away. If you can think clearly and swallow safely, follow the 15:15 rule:  Take 15 grams of a fast-acting carb (carbohydrate). Talk with your doctor about how much you should take.  Some fast-acting carbs are: ? Sugar tablets (glucose pills). Take 3-4 glucose pills. ? 6-8 pieces of hard candy. ? 4-6 oz (120-150 mL) of fruit juice. ? 4-6 oz (120-150 mL) of regular (not diet) soda. ? 1 Tbsp (15 mL) honey or sugar.  Check your blood sugar 15 minutes after you take the carb.  If your blood sugar is still at or below 70 mg/dL (3.9 mmol/L), take 15 grams of a carb again.  If your blood sugar does not go above 70 mg/dL (3.9 mmol/L) after 3 tries, get help right away.  After your blood sugar goes back to normal, eat a meal or a snack within 1 hour. Treating very low blood sugar If your blood sugar is at or below 54 mg/dL (3 mmol/L), you have very low blood sugar (severe hypoglycemia). This is an emergency. Do not wait to see if the symptoms will go away. Get medical help right  away. Call your local emergency services (911 in the [place] If you have very low blood sugar and you cannot eat or drink, you may need a glucagon shot (injection). A family member or friend should learn how to check your blood sugar and how to give you a glucagon shot. Ask your doctor if you need to have a glucagon shot kit at home. Follow these instructions at home: Medicine  Take your insulin and diabetes medicines as told.  If your doctor says you should take more or less insulin or medicines, do this exactly as told.  Do not run out of insulin or  medicines. Food   Make healthy food choices. These include: ? Chicken, fish, egg whites, and beans. ? Oats, whole wheat, bulgur, brown rice, quinoa, and millet. ? Fresh fruits and vegetables. ? Low-fat dairy products. ? Nuts, avocado, olive oil, and canola oil.  Meet with a food specialist (dietitian). He or she can help you make an eating plan that is right for you.  Follow instructions from your doctor about what you cannot eat or drink.  Drink enough fluid to keep your pee (urine) pale yellow.  Eat healthy snacks between healthy meals.  Keep track of carbs that you eat. Do this by reading food labels and learning food serving sizes.  Follow your sick day plan when you cannot eat or drink normally. Make this plan with your doctor so it is ready to use. Activity  Exercise for 30 or more minutes a day, or as much as your doctor recommends.  Talk with your doctor before you start a new exercise or activity. Your doctor may need to tell you to change: ? How much insulin or medicines you take. ? How much food you eat. Lifestyle  Do not drink alcohol.  Do not use any tobacco products. These include cigarettes, chewing tobacco, and e-cigarettes. If you need help quitting, ask your doctor.  Learn how to deal with stress. If you need help with this, ask your doctor. Body care  Stay up to date with your shots (immunizations).  Brush your teeth and gums two times a day. Floss one or more times a day.  Go to the dentist one or more times every 6 months.  Stay at a healthy weight while you are pregnant. General instructions  Take over-the-counter and prescription medicines only as told by your doctor.  Ask your doctor about risks of high blood pressure in pregnancy (preeclampsia and eclampsia).  Share your diabetes care plan with: ? Your work or school. ? People you live with.  Check your pee for ketones: ? When you are sick. ? As told by your doctor.  Carry a card or  wear jewelry that says you have diabetes.  Keep all follow-up visits as told by your doctor. This is important. Care after giving birth  Have your blood sugar checked 4-12 weeks after you give birth.  Get checked for diabetes one or more times during 3 years. Questions to ask your doctor  Do I need to meet with a diabetes educator?  Where can I find a support group for people with gestational diabetes? Where to find more information To learn more about diabetes, visit:  American Diabetes Association: www.diabetes.org  Centers for Disease Control and Prevention (CDC): www.cdc.gov Summary  Check your blood sugar (glucose) every day while you are pregnant. Check it as often as told.  Take your insulin and diabetes medicines as told.  Keep all follow-up visits as   told by your doctor. This is important. °· Have your blood sugar checked 4-12 weeks after you give birth. °This information is not intended to replace advice given to you by your health care provider. Make sure you discuss any questions you have with your health care provider. °Document Revised: 06/09/2018 Document Reviewed: 01/20/2016 °Elsevier Patient Education © 2020 Elsevier Inc. ° ° °Hypertension During Pregnancy °Hypertension is also called high blood pressure. High blood pressure means that the force of your blood moving in your body is too strong. It can cause problems for you and your baby. Different types of high blood pressure can happen during pregnancy. The types are: °· High blood pressure before you got pregnant. This is called chronic hypertension.  This can continue during your pregnancy. Your doctor will want to keep checking your blood pressure. You may need medicine to keep your blood pressure under control while you are pregnant. You will need follow-up visits after you have your baby. °· High blood pressure that goes up during pregnancy when it was normal before. This is called gestational hypertension. It will  usually get better after you have your baby, but your doctor will need to watch your blood pressure to make sure that it is getting better. °· Very high blood pressure during pregnancy. This is called preeclampsia. Very high blood pressure is an emergency that needs to be checked and treated right away. °· You may develop very high blood pressure after giving birth. This is called postpartum preeclampsia. This usually occurs within 48 hours after childbirth but may occur up to 6 weeks after giving birth. This is rare. °How does this affect me? °If you have high blood pressure during pregnancy, you have a higher chance of developing high blood pressure: °· As you get older. °· If you get pregnant again. °In some cases, high blood pressure during pregnancy can cause: °· Stroke. °· Heart attack. °· Damage to the kidneys, lungs, or liver. °· Preeclampsia. °· Jerky movements you cannot control (convulsions or seizures). °· Problems with the placenta. °How does this affect my baby? °Your baby may: °· Be born early. °· Not weigh as much as he or she should. °· Not handle labor well, leading to a c-section birth. °What are the risks? °· Having high blood pressure during a past pregnancy. °· Being overweight. °· Being 35 years old or older. °· Being pregnant for the first time. °· Being pregnant with more than one baby. °· Becoming pregnant using fertility methods, such as IVF. °· Having other problems, such as diabetes, or kidney disease. °· Having family members who have high blood pressure. °What can I do to lower my risk? ° °· Keep a healthy weight. °· Eat a healthy diet. °· Follow what your doctor tells you about treating any medical problems that you had before becoming pregnant. °It is very important to go to all of your doctor visits. Your doctor will check your blood pressure and make sure that your pregnancy is progressing as it should. Treatment should start early if a problem is found. °How is this  treated? °Treatment for high blood pressure during pregnancy can differ depending on the type of high blood pressure you have and how serious it is. °· You may need to take blood pressure medicine. °· If you have been taking medicine for your blood pressure, you may need to change the medicine during pregnancy if it is not safe for your baby. °· If your doctor thinks that you   could get very high blood pressure, he or she may tell you to take a low-dose aspirin during your pregnancy. °· If you have very high blood pressure, you may need to stay in the hospital so you and your baby can be watched closely. You may also need to take medicine to lower your blood pressure. This medicine may be given by mouth or through an IV tube. °· In some cases, if your condition gets worse, you may need to have your baby early. °Follow these instructions at home: °Eating and drinking ° °· Drink enough fluid to keep your pee (urine) pale yellow. °· Avoid caffeine. °Lifestyle °· Do not use any products that contain nicotine or tobacco, such as cigarettes, e-cigarettes, and chewing tobacco. If you need help quitting, ask your doctor. °· Do not use alcohol or drugs. °· Avoid stress. °· Rest and get plenty of sleep. °· Regular exercise can help. Ask your doctor what kinds of exercise are best for you. °General instructions °· Take over-the-counter and prescription medicines only as told by your doctor. °· Keep all prenatal and follow-up visits as told by your doctor. This is important. °Contact a doctor if: °· You have symptoms that your doctor told you to watch for, such as: °? Headaches. °? Nausea. °? Vomiting. °? Belly (abdominal) pain. °? Dizziness. °? Light-headedness. °Get help right away if: °· You have: °? Very bad belly pain that does not get better with treatment. °? A very bad headache that does not get better. °? Vomiting that does not get better. °? Sudden, fast weight gain. °? Sudden swelling in your hands, ankles, or  face. °? Bleeding from your vagina. °? Blood in your pee. °? Blurry vision. °? Double vision. °? Shortness of breath. °? Chest pain. °? Weakness on one side of your body. °? Trouble talking. °· Your baby is not moving as much as usual. °Summary °· High blood pressure is also called hypertension. °· High blood pressure means that the force of your blood moving in your body is too strong. °· High blood pressure can cause problems for you and your baby. °· Keep all follow-up visits as told by your doctor. This is important. °This information is not intended to replace advice given to you by your health care provider. Make sure you discuss any questions you have with your health care provider. °Document Revised: 04/09/2019 Document Reviewed: 01/13/2019 °Elsevier Patient Education © 2020 Elsevier Inc. ° °

## 2020-12-05 NOTE — Progress Notes (Signed)
   PRENATAL VISIT NOTE  Subjective:  Joanna Rogers is a 24 y.o. G1P0 at [redacted]w[redacted]d being seen today for ongoing prenatal care.  She is currently monitored for the following issues for this high-risk pregnancy and has Supervision of high risk pregnancy, antepartum; Chronic hypertension in obstetric context in second trimester; Asthma affecting pregnancy in second trimester; and Gestational diabetes mellitus (GDM), antepartum on their problem list.  Patient doing well with no acute concerns today. She reports no complaints.  Contractions: Not present. Vag. Bleeding: None.  Movement: Present. Denies leaking of fluid.   Pt denies headache, visual changes or RUQ pain  The following portions of the patient's history were reviewed and updated as appropriate: allergies, current medications, past family history, past medical history, past social history, past surgical history and problem list. Problem list updated.  Objective:   Vitals:   12/05/20 1519  BP: (!) 139/96  Pulse: 90  Temp: 98 F (36.7 C)  Weight: 282 lb 8 oz (128.1 kg)   Blood pressure recheck 126/91 Fetal Status: Fetal Heart Rate (bpm): 156 Fundal Height: 37 cm Movement: Present     General:  Alert, oriented and cooperative. Patient is in no acute distress.  Skin: Skin is warm and dry. No rash noted.   Cardiovascular: Normal heart rate noted  Respiratory: Normal respiratory effort, no problems with respiration noted  Abdomen: Soft, gravid, appropriate for gestational age. obese  Pain/Pressure: Present     Pelvic: Cervical exam deferred        Extremities: Normal range of motion.  Edema: Trace  Mental Status:  Normal mood and affect. Normal behavior. Normal judgment and thought content.   Assessment and Plan:  Pregnancy: G1P0 at [redacted]w[redacted]d  1. Supervision of high risk pregnancy, antepartum   2. Chronic hypertension in obstetric context in second trimester Continue labetalol, blood pressure today 126/91 on recheck, no s/sx of  preeclampsia Depending on blood pressure control, plan for delivery at 38 weeks per guidelines  3. Gestational diabetes mellitus (GDM) controlled on oral hypoglycemic drug, antepartum Excellent glucose control with glyburide  Preterm labor symptoms and general obstetric precautions including but not limited to vaginal bleeding, contractions, leaking of fluid and fetal movement were reviewed in detail with the patient.  Please refer to After Visit Summary for other counseling recommendations.   Return in about 1 week (around 12/12/2020) for [facility], in person, 36 weeks swabs.   Mariel Aloe, MD

## 2020-12-05 NOTE — Progress Notes (Signed)
Patient reports fetal movement with some back pain.  

## 2020-12-07 ENCOUNTER — Inpatient Hospital Stay (HOSPITAL_COMMUNITY)
Admission: AD | Admit: 2020-12-07 | Discharge: 2020-12-07 | Disposition: A | Discharge status: Home / Self Care | Payer: Medicaid Other | Attending: Obstetrics & Gynecology | Admitting: Obstetrics & Gynecology

## 2020-12-07 ENCOUNTER — Other Ambulatory Visit: Payer: Self-pay | Admitting: Obstetrics and Gynecology

## 2020-12-07 ENCOUNTER — Other Ambulatory Visit: Payer: Self-pay

## 2020-12-07 ENCOUNTER — Telehealth: Payer: Self-pay | Admitting: *Deleted

## 2020-12-07 ENCOUNTER — Encounter (HOSPITAL_COMMUNITY): Payer: Self-pay | Admitting: Obstetrics & Gynecology

## 2020-12-07 DIAGNOSIS — Z8249 Family history of ischemic heart disease and other diseases of the circulatory system: Secondary | ICD-10-CM | POA: Diagnosis not present

## 2020-12-07 DIAGNOSIS — Z79899 Other long term (current) drug therapy: Secondary | ICD-10-CM | POA: Diagnosis not present

## 2020-12-07 DIAGNOSIS — Z7984 Long term (current) use of oral hypoglycemic drugs: Secondary | ICD-10-CM | POA: Diagnosis not present

## 2020-12-07 DIAGNOSIS — Z3A36 36 weeks gestation of pregnancy: Secondary | ICD-10-CM | POA: Diagnosis not present

## 2020-12-07 DIAGNOSIS — O26893 Other specified pregnancy related conditions, third trimester: Secondary | ICD-10-CM | POA: Diagnosis not present

## 2020-12-07 DIAGNOSIS — Z7982 Long term (current) use of aspirin: Secondary | ICD-10-CM | POA: Diagnosis not present

## 2020-12-07 DIAGNOSIS — M545 Low back pain, unspecified: Secondary | ICD-10-CM | POA: Diagnosis not present

## 2020-12-07 DIAGNOSIS — O10913 Unspecified pre-existing hypertension complicating pregnancy, third trimester: Secondary | ICD-10-CM

## 2020-12-07 DIAGNOSIS — O0990 Supervision of high risk pregnancy, unspecified, unspecified trimester: Secondary | ICD-10-CM

## 2020-12-07 DIAGNOSIS — O10912 Unspecified pre-existing hypertension complicating pregnancy, second trimester: Secondary | ICD-10-CM

## 2020-12-07 LAB — CBC
HCT: 36.7 % (ref 36.0–46.0)
Hemoglobin: 11.8 g/dL — ABNORMAL LOW (ref 12.0–15.0)
MCH: 26.5 pg (ref 26.0–34.0)
MCHC: 32.2 g/dL (ref 30.0–36.0)
MCV: 82.5 fL (ref 80.0–100.0)
Platelets: 221 10*3/uL (ref 150–400)
RBC: 4.45 MIL/uL (ref 3.87–5.11)
RDW: 13.8 % (ref 11.5–15.5)
WBC: 9.4 10*3/uL (ref 4.0–10.5)
nRBC: 0 % (ref 0.0–0.2)

## 2020-12-07 LAB — COMPREHENSIVE METABOLIC PANEL
ALT: 24 U/L (ref 0–44)
AST: 28 U/L (ref 15–41)
Albumin: 2.8 g/dL — ABNORMAL LOW (ref 3.5–5.0)
Alkaline Phosphatase: 167 U/L — ABNORMAL HIGH (ref 38–126)
Anion gap: 11 (ref 5–15)
BUN: 9 mg/dL (ref 6–20)
CO2: 20 mmol/L — ABNORMAL LOW (ref 22–32)
Calcium: 9.4 mg/dL (ref 8.9–10.3)
Chloride: 102 mmol/L (ref 98–111)
Creatinine, Ser: 0.76 mg/dL (ref 0.44–1.00)
GFR, Estimated: >60 mL/min (ref >60)
Glucose, Bld: 112 mg/dL — ABNORMAL HIGH (ref 70–99)
Potassium: 3.9 mmol/L (ref 3.5–5.1)
Sodium: 133 mmol/L — ABNORMAL LOW (ref 135–145)
Total Bilirubin: 0.3 mg/dL (ref 0.3–1.2)
Total Protein: 6.9 g/dL (ref 6.5–8.1)

## 2020-12-07 LAB — URINALYSIS, ROUTINE W REFLEX MICROSCOPIC
Bilirubin Urine: NEGATIVE
Glucose, UA: NEGATIVE mg/dL
Hgb urine dipstick: NEGATIVE
Ketones, ur: NEGATIVE mg/dL
Leukocytes,Ua: NEGATIVE
Nitrite: NEGATIVE
Protein, ur: NEGATIVE mg/dL
Specific Gravity, Urine: 1.017 (ref 1.005–1.030)
pH: 5 (ref 5.0–8.0)

## 2020-12-07 LAB — PROTEIN / CREATININE RATIO, URINE
Creatinine, Urine: 200.71 mg/dL
Protein Creatinine Ratio: 0.09 mg/mg{Cre} (ref 0.00–0.15)
Total Protein, Urine: 18 mg/dL

## 2020-12-07 NOTE — MAU Provider Note (Signed)
Chief Complaint:  Hypertension   First Provider Initiated Contact with Patient 12/07/20 2047     HPI: Joanna Rogers is a 24 y.o. G1P0 at 22w0dho presents to maternity admissions reporting elevated BP at home.  Is on labetalol for Chronic HTN She reports good fetal movement, denies LOF, vaginal bleeding, vaginal itching/burning, urinary symptoms, h/a, dizziness, n/v, diarrhea, constipation or fever/chills.  She denies headache, visual changes or RUQ abdominal pain.  Hypertension This is a recurrent problem. Pertinent negatives include no anxiety, blurred vision, chest pain, headaches, malaise/fatigue or peripheral edema. There are no associated agents to hypertension. Treatments tried: labetalol. The current treatment provides mild improvement. There are no compliance problems.     RN Note: Took my b/p at home last night and today and my bottom number was high. Office told me to come in. Some lower back pain. No headaches or visual changes. Taking Labetalol 302mBID. Had am dose and takes pm dose at 2000  Past Medical History: Past Medical History:  Diagnosis Date  . Gestational diabetes   . Hypertension affecting pregnancy     Past obstetric history: OB History  Gravida Para Term Preterm AB Living  1         0  SAB TAB Ectopic Multiple Live Births               # Outcome Date GA Lbr Len/2nd Weight Sex Delivery Anes PTL Lv  1 Current             Past Surgical History: Past Surgical History:  Procedure Laterality Date  . NO PAST SURGERIES      Family History: Family History  Problem Relation Age of Onset  . High Cholesterol Mother   . Hypertension Father   . Diabetes Father   . Kidney Stones Father   . Breast cancer Maternal Grandmother   . Cervical cancer Maternal Grandmother   . Hypertension Maternal Grandfather   . Kidney Stones Paternal Grandmother   . Clotting disorder Paternal Grandmother   . Diabetes Paternal Grandfather   . Hypertension Paternal Grandfather      Social History: Social History   Tobacco Use  . Smoking status: Never Smoker  . Smokeless tobacco: Never Used  Vaping Use  . Vaping Use: Never used  Substance Use Topics  . Alcohol use: Not Currently  . Drug use: Not Currently    Allergies:  Allergies  Allergen Reactions  . Latex Swelling    Meds:  Facility-Administered Medications Prior to Admission  Medication Dose Route Frequency Provider Last Rate Last Admin  . aspirin chewable tablet 81 mg  81 mg Oral Daily BaGriffin BasilMD       Medications Prior to Admission  Medication Sig Dispense Refill Last Dose  . Accu-Chek Softclix Lancets lancets 1 each by Other route 4 (four) times daily. 100 each 12   . albuterol (PROAIR HFA) 108 (90 Base) MCG/ACT inhaler Inhale 1-2 puffs into the lungs every 6 (six) hours as needed for wheezing or shortness of breath. 18 g 4   . ALBUTEROL IN Inhale into the lungs.     . Marland Kitchenspirin EC 81 MG tablet Take 1 tablet (81 mg total) by mouth daily. Take after 12 weeks for prevention of preeclampsia later in pregnancy (Patient not taking: Reported on 12/05/2020) 300 tablet 2   . aspirin EC 81 MG tablet Take 1 tablet (81 mg total) by mouth daily. Take after 12 weeks for prevention of preeclampsia later in pregnancy 300  tablet 2   . Blood Glucose Monitoring Suppl (ACCU-CHEK GUIDE) w/Device KIT 1 Device by Does not apply route 4 (four) times daily. 1 kit 0   . glucose blood (ACCU-CHEK GUIDE) test strip Use to check blood sugars four times a day was instructed 50 each 12   . glyBURIDE (DIABETA) 2.5 MG tablet Take 1 tablet (2.5 mg total) by mouth 2 (two) times daily with a meal. 60 tablet 3   . labetalol (NORMODYNE) 200 MG tablet Take 1.5 tablets (300 mg total) by mouth 2 (two) times daily. 90 tablet 3   . omeprazole (PRILOSEC) 10 MG capsule Take 1 capsule (10 mg total) by mouth daily. 30 capsule 2   . Prenatal Vit w/Fe-Methylfol-FA (PNV PO) Take by mouth.     . promethazine (PHENERGAN) 25 MG suppository  Place 1 suppository (25 mg total) rectally every 6 (six) hours as needed for nausea. (Patient not taking: Reported on 11/01/2020) 12 suppository 1     I have reviewed patient's Past Medical Hx, Surgical Hx, Family Hx, Social Hx, medications and allergies.   ROS:  Review of Systems  Constitutional: Negative for malaise/fatigue.  Eyes: Negative for blurred vision.  Cardiovascular: Negative for chest pain.  Neurological: Negative for headaches.   Other systems negative  Physical Exam   Patient Vitals for the past 24 hrs:  BP Temp Pulse Resp Height Weight  12/07/20 1925 (!) 129/92 -- 91 -- -- --  12/07/20 1923 -- 98.4 F (36.9 C) -- 18 '5\' 9"'  (1.753 m) 128.8 kg   Vitals:   12/07/20 2130 12/07/20 2145 12/07/20 2200 12/07/20 2215  BP: 127/65 118/68 130/77 123/67  Pulse: 72 72 75 68  Resp:      Temp:      Weight:   128.8 kg   Height:   '5\' 9"'  (1.753 m)     Constitutional: Well-developed, well-nourished female in no acute distress.  Cardiovascular: normal rate and rhythm Respiratory: normal effort, clear to auscultation bilaterally GI: Abd soft, non-tender, gravid appropriate for gestational age.   No rebound or guarding. MS: Extremities nontender, no edema, normal ROM Neurologic: Alert and oriented x 4.  DTRs 2+ with no clonus GU: Neg CVAT.  PELVIC EXAM: deferred  FHT:  Baseline 140 , moderate variability, accelerations present, no decelerations Contractions:  Irregular     Labs: B/Positive/-- (07/06 1610) Results for orders placed or performed during the hospital encounter of 12/07/20 (from the past 24 hour(s))  Urinalysis, Routine w reflex microscopic Urine, Clean Catch     Status: Abnormal   Collection Time: 12/07/20  7:30 PM  Result Value Ref Range   Color, Urine YELLOW YELLOW   APPearance HAZY (A) CLEAR   Specific Gravity, Urine 1.017 1.005 - 1.030   pH 5.0 5.0 - 8.0   Glucose, UA NEGATIVE NEGATIVE mg/dL   Hgb urine dipstick NEGATIVE NEGATIVE   Bilirubin Urine  NEGATIVE NEGATIVE   Ketones, ur NEGATIVE NEGATIVE mg/dL   Protein, ur NEGATIVE NEGATIVE mg/dL   Nitrite NEGATIVE NEGATIVE   Leukocytes,Ua NEGATIVE NEGATIVE  Protein / creatinine ratio, urine     Status: None   Collection Time: 12/07/20  7:30 PM  Result Value Ref Range   Creatinine, Urine 200.71 mg/dL   Total Protein, Urine 18 mg/dL   Protein Creatinine Ratio 0.09 0.00 - 0.15 mg/mg[Cre]  CBC     Status: Abnormal   Collection Time: 12/07/20  8:40 PM  Result Value Ref Range   WBC 9.4 4.0 - 10.5 K/uL  RBC 4.45 3.87 - 5.11 MIL/uL   Hemoglobin 11.8 (L) 12.0 - 15.0 g/dL   HCT 36.7 36.0 - 46.0 %   MCV 82.5 80.0 - 100.0 fL   MCH 26.5 26.0 - 34.0 pg   MCHC 32.2 30.0 - 36.0 g/dL   RDW 13.8 11.5 - 15.5 %   Platelets 221 150 - 400 K/uL   nRBC 0.0 0.0 - 0.2 %  Comprehensive metabolic panel     Status: Abnormal   Collection Time: 12/07/20  8:40 PM  Result Value Ref Range   Sodium 133 (L) 135 - 145 mmol/L   Potassium 3.9 3.5 - 5.1 mmol/L   Chloride 102 98 - 111 mmol/L   CO2 20 (L) 22 - 32 mmol/L   Glucose, Bld 112 (H) 70 - 99 mg/dL   BUN 9 6 - 20 mg/dL   Creatinine, Ser 0.76 0.44 - 1.00 mg/dL   Calcium 9.4 8.9 - 10.3 mg/dL   Total Protein 6.9 6.5 - 8.1 g/dL   Albumin 2.8 (L) 3.5 - 5.0 g/dL   AST 28 15 - 41 U/L   ALT 24 0 - 44 U/L   Alkaline Phosphatase 167 (H) 38 - 126 U/L   Total Bilirubin 0.3 0.3 - 1.2 mg/dL   GFR, Estimated >60 >60 mL/min   Anion gap 11 5 - 15    Imaging:    MAU Course/MDM: I have ordered labs and reviewed results. These are normal.   NST reviewed, reactive  Treatments in MAU included EFM.   Discussed BPs have resolved and labs are normal  Will not raise her dosage of med at this time per recommendation of Dr Roselie Awkward.   Assessment: SIngle IUP at 25w1dChronic Hypertension No signs of preeclampsia  Plan: Discharge home Labor precautions and fetal kick counts Preeclampsia precautions Follow up in Office for prenatal visits   Encouraged to return if  she develops worsening of symptoms, increase in pain, fever, or other concerning symptoms.   Pt stable at time of discharge.  MHansel FeinsteinCNM, MSN Certified Nurse-Midwife 12/07/2020 8:47 PM

## 2020-12-07 NOTE — Discharge Instructions (Signed)

## 2020-12-07 NOTE — Telephone Encounter (Signed)
Patient called clinic back reporting BP reading of 138/93. Advised patient to be seen at MAU for further evaluation per Dr. Donavan Foil. Patient voiced understanding.  Clovis Pu, RN

## 2020-12-07 NOTE — Telephone Encounter (Signed)
[employer]s left voice message on nurse triage line stating patient's BP readings were elevated 12/06/20 and complaint of headache.    Spoke with patient is afternoon, patient denied headache and BP reading 143/96. Patient reported taking BP medication this morning and all other medication as prescribed.  Patient denies any other symptoms. Advised patient to recheck BP this evening, send mychart message. If patient start experiencing severe headache, with no relief from Tylenol, chest pain, SOB, dizziness, visual changes, abdominal pain, regular contractions, increased pelvic pressure/pain to go to MAU.  Clovis Pu, RN

## 2020-12-07 NOTE — MAU Note (Signed)
Took my b/p at home last night and today and my bottom number was high. Office told me to come in. Some lower back pain. No headaches or visual changes. Taking Labetalol 300mg  BID. Had am dose and takes pm dose at 2000

## 2020-12-08 ENCOUNTER — Ambulatory Visit: Payer: Medicaid Other | Admitting: *Deleted

## 2020-12-08 ENCOUNTER — Ambulatory Visit: Payer: Medicaid Other | Attending: Obstetrics and Gynecology

## 2020-12-08 ENCOUNTER — Encounter: Payer: Self-pay | Admitting: *Deleted

## 2020-12-08 VITALS — BP 148/87 | HR 77

## 2020-12-08 DIAGNOSIS — O24415 Gestational diabetes mellitus in pregnancy, controlled by oral hypoglycemic drugs: Secondary | ICD-10-CM | POA: Diagnosis not present

## 2020-12-08 DIAGNOSIS — Z3A36 36 weeks gestation of pregnancy: Secondary | ICD-10-CM

## 2020-12-08 DIAGNOSIS — O99513 Diseases of the respiratory system complicating pregnancy, third trimester: Secondary | ICD-10-CM | POA: Diagnosis not present

## 2020-12-08 DIAGNOSIS — J45909 Unspecified asthma, uncomplicated: Secondary | ICD-10-CM

## 2020-12-08 DIAGNOSIS — O99213 Obesity complicating pregnancy, third trimester: Secondary | ICD-10-CM | POA: Diagnosis not present

## 2020-12-08 DIAGNOSIS — O10919 Unspecified pre-existing hypertension complicating pregnancy, unspecified trimester: Secondary | ICD-10-CM | POA: Insufficient documentation

## 2020-12-08 DIAGNOSIS — O10913 Unspecified pre-existing hypertension complicating pregnancy, third trimester: Secondary | ICD-10-CM | POA: Diagnosis present

## 2020-12-08 NOTE — Progress Notes (Signed)
Was seen at hospital 12/07/20 for elevated BP.

## 2020-12-09 ENCOUNTER — Telehealth (HOSPITAL_COMMUNITY): Payer: Self-pay | Admitting: *Deleted

## 2020-12-09 ENCOUNTER — Encounter (HOSPITAL_COMMUNITY): Payer: Self-pay | Admitting: *Deleted

## 2020-12-09 NOTE — Telephone Encounter (Signed)
Preadmission screen  

## 2020-12-11 ENCOUNTER — Other Ambulatory Visit: Payer: Self-pay | Admitting: Advanced Practice Midwife

## 2020-12-12 ENCOUNTER — Telehealth: Payer: Self-pay

## 2020-12-12 NOTE — Telephone Encounter (Signed)
S/w pt after babyscripts alerted elevated BG from Friday. Pt states that fasting today is below 90 but she does not have the exact number in front of her, BP 130/86. Pt denies any abnormal symptoms.

## 2020-12-14 ENCOUNTER — Other Ambulatory Visit (HOSPITAL_COMMUNITY)
Admission: RE | Admit: 2020-12-14 | Discharge: 2020-12-14 | Disposition: A | Discharge status: Home / Self Care | Payer: Medicaid Other | Source: Ambulatory Visit | Attending: Obstetrics & Gynecology | Admitting: Obstetrics & Gynecology

## 2020-12-14 ENCOUNTER — Other Ambulatory Visit: Payer: Self-pay

## 2020-12-14 ENCOUNTER — Ambulatory Visit (INDEPENDENT_AMBULATORY_CARE_PROVIDER_SITE_OTHER): Payer: Medicaid Other | Admitting: Obstetrics & Gynecology

## 2020-12-14 VITALS — BP 138/95 | HR 86 | Wt 286.0 lb

## 2020-12-14 DIAGNOSIS — O10912 Unspecified pre-existing hypertension complicating pregnancy, second trimester: Secondary | ICD-10-CM

## 2020-12-14 DIAGNOSIS — O0990 Supervision of high risk pregnancy, unspecified, unspecified trimester: Secondary | ICD-10-CM

## 2020-12-14 DIAGNOSIS — O24415 Gestational diabetes mellitus in pregnancy, controlled by oral hypoglycemic drugs: Secondary | ICD-10-CM

## 2020-12-14 MED ORDER — LABETALOL HCL 200 MG PO TABS: 400.0000 mg | ORAL_TABLET | Freq: Two times a day (BID) | ORAL | 3 refills | Status: DC

## 2020-12-14 NOTE — Progress Notes (Signed)
HOB

## 2020-12-14 NOTE — Patient Instructions (Signed)

## 2020-12-14 NOTE — Progress Notes (Signed)
   PRENATAL VISIT NOTE  Subjective:  Joanna Rogers is a 24 y.o. G1P0 at [redacted]w[redacted]d being seen today for ongoing prenatal care.  She is currently monitored for the following issues for this high-risk pregnancy and has Supervision of high risk pregnancy, antepartum; Chronic hypertension in obstetric context in second trimester; Asthma affecting pregnancy in second trimester; and Gestational diabetes mellitus (GDM), antepartum on their problem list.  Patient reports no complaints.  Contractions: Not present. Vag. Bleeding: None.  Movement: Present. Denies leaking of fluid.   The following portions of the patient's history were reviewed and updated as appropriate: allergies, current medications, past family history, past medical history, past social history, past surgical history and problem list.   Objective:   Vitals:   12/14/20 1504 12/14/20 1507  BP: (!) 142/100 (!) 138/95  Pulse: 91 86  Weight: 286 lb (129.7 kg)     Fetal Status: Fetal Heart Rate (bpm): 140   Movement: Present  Presentation: Vertex  General:  Alert, oriented and cooperative. Patient is in no acute distress.  Skin: Skin is warm and dry. No rash noted.   Cardiovascular: Normal heart rate noted  Respiratory: Normal respiratory effort, no problems with respiration noted  Abdomen: Soft, gravid, appropriate for gestational age.  Pain/Pressure: Present     Pelvic: Cervical exam performed in the presence of a chaperone Dilation: 1 Effacement (%): 60 Station: -3  Extremities: Normal range of motion.     Mental Status: Normal mood and affect. Normal behavior. Normal judgment and thought content.   Assessment and Plan:  Pregnancy: G1P0 at [redacted]w[redacted]d 1. Supervision of high risk pregnancy, antepartum Routine labs - Strep Gp B NAA - Cervicovaginal ancillary only( [place]) - labetalol (NORMODYNE) 200 MG tablet; Take 2 tablets (400 mg total) by mouth 2 (two) times daily.  Dispense: 90 tablet; Refill: 3  2. Chronic hypertension in  obstetric context in second trimester BP needs improved control - labetalol (NORMODYNE) 200 MG tablet; Take 2 tablets (400 mg total) by mouth 2 (two) times daily.  Dispense: 90 tablet; Refill: 3  3. Gestational diabetes mellitus (GDM) controlled on oral hypoglycemic drug, antepartum FBS and PP in range with current medical management IOL 38 weeks Term labor symptoms and general obstetric precautions including but not limited to vaginal bleeding, contractions, leaking of fluid and fetal movement were reviewed in detail with the patient. Please refer to After Visit Summary for other counseling recommendations.   Return if symptoms worsen or fail to improve, for IOL is scheduled.  Future Appointments  Date Time Provider Department Center  12/15/2020  2:45 PM [facility] NURSE [facility]  12/15/2020  3:00 PM [facility] US1 [facility]  12/19/2020  9:45 AM MC-SCREENING [facility]-SDSC None  12/21/2020  7:55 AM MC-LD SCHED ROOM MC-INDC None    Scheryl Darter, MDPt states her BP has been running higher at home. BP is elevated in office today. Pt states she has slight HA today. Pt states some LE swelling.

## 2020-12-15 ENCOUNTER — Ambulatory Visit: Payer: Medicaid Other | Attending: Obstetrics and Gynecology

## 2020-12-15 ENCOUNTER — Encounter: Payer: Self-pay | Admitting: *Deleted

## 2020-12-15 ENCOUNTER — Ambulatory Visit: Payer: Medicaid Other | Admitting: *Deleted

## 2020-12-15 VITALS — BP 137/83 | HR 83

## 2020-12-15 DIAGNOSIS — O24415 Gestational diabetes mellitus in pregnancy, controlled by oral hypoglycemic drugs: Secondary | ICD-10-CM | POA: Diagnosis not present

## 2020-12-15 DIAGNOSIS — O10913 Unspecified pre-existing hypertension complicating pregnancy, third trimester: Secondary | ICD-10-CM | POA: Diagnosis not present

## 2020-12-15 DIAGNOSIS — O10919 Unspecified pre-existing hypertension complicating pregnancy, unspecified trimester: Secondary | ICD-10-CM | POA: Insufficient documentation

## 2020-12-15 DIAGNOSIS — O99513 Diseases of the respiratory system complicating pregnancy, third trimester: Secondary | ICD-10-CM | POA: Diagnosis not present

## 2020-12-15 DIAGNOSIS — E669 Obesity, unspecified: Secondary | ICD-10-CM

## 2020-12-15 DIAGNOSIS — Z3A37 37 weeks gestation of pregnancy: Secondary | ICD-10-CM

## 2020-12-15 DIAGNOSIS — O99213 Obesity complicating pregnancy, third trimester: Secondary | ICD-10-CM

## 2020-12-15 DIAGNOSIS — J45909 Unspecified asthma, uncomplicated: Secondary | ICD-10-CM

## 2020-12-15 LAB — CERVICOVAGINAL ANCILLARY ONLY
Chlamydia: NEGATIVE
Comment: NEGATIVE
Comment: NORMAL
Neisseria Gonorrhea: NEGATIVE

## 2020-12-16 LAB — STREP GP B NAA: Strep Gp B NAA: POSITIVE — AB

## 2020-12-19 ENCOUNTER — Other Ambulatory Visit (HOSPITAL_COMMUNITY)
Admission: RE | Admit: 2020-12-19 | Discharge: 2020-12-19 | Disposition: A | Discharge status: Home / Self Care | Payer: Medicaid Other | Source: Ambulatory Visit | Attending: Family Medicine | Admitting: Family Medicine

## 2020-12-19 DIAGNOSIS — Z01812 Encounter for preprocedural laboratory examination: Secondary | ICD-10-CM | POA: Diagnosis not present

## 2020-12-19 DIAGNOSIS — Z20822 Contact with and (suspected) exposure to covid-19: Secondary | ICD-10-CM | POA: Insufficient documentation

## 2020-12-19 LAB — SARS CORONAVIRUS 2 (TAT 6-24 HRS): SARS Coronavirus 2: NEGATIVE

## 2020-12-21 ENCOUNTER — Inpatient Hospital Stay (HOSPITAL_COMMUNITY): Payer: Medicaid Other

## 2020-12-21 ENCOUNTER — Inpatient Hospital Stay (HOSPITAL_COMMUNITY)
Admission: AD | Admit: 2020-12-21 | Discharge: 2020-12-24 | DRG: 806 | Disposition: A | Discharge status: Home / Self Care | Payer: Medicaid Other | Attending: Family Medicine | Admitting: Family Medicine

## 2020-12-21 ENCOUNTER — Other Ambulatory Visit: Payer: Self-pay

## 2020-12-21 ENCOUNTER — Encounter (HOSPITAL_COMMUNITY): Payer: Self-pay | Admitting: Obstetrics and Gynecology

## 2020-12-21 DIAGNOSIS — O24419 Gestational diabetes mellitus in pregnancy, unspecified control: Secondary | ICD-10-CM | POA: Diagnosis present

## 2020-12-21 DIAGNOSIS — J45909 Unspecified asthma, uncomplicated: Secondary | ICD-10-CM | POA: Diagnosis present

## 2020-12-21 DIAGNOSIS — O99824 Streptococcus B carrier state complicating childbirth: Secondary | ICD-10-CM | POA: Diagnosis present

## 2020-12-21 DIAGNOSIS — Z91040 Latex allergy status: Secondary | ICD-10-CM

## 2020-12-21 DIAGNOSIS — O1002 Pre-existing essential hypertension complicating childbirth: Principal | ICD-10-CM | POA: Diagnosis present

## 2020-12-21 DIAGNOSIS — Z3A38 38 weeks gestation of pregnancy: Secondary | ICD-10-CM

## 2020-12-21 DIAGNOSIS — O9952 Diseases of the respiratory system complicating childbirth: Secondary | ICD-10-CM | POA: Diagnosis present

## 2020-12-21 DIAGNOSIS — O10913 Unspecified pre-existing hypertension complicating pregnancy, third trimester: Secondary | ICD-10-CM | POA: Diagnosis present

## 2020-12-21 DIAGNOSIS — O24425 Gestational diabetes mellitus in childbirth, controlled by oral hypoglycemic drugs: Secondary | ICD-10-CM | POA: Diagnosis present

## 2020-12-21 LAB — TYPE AND SCREEN
ABO/RH(D): B POS
Antibody Screen: NEGATIVE

## 2020-12-21 LAB — COMPREHENSIVE METABOLIC PANEL
ALT: 22 U/L (ref 0–44)
AST: 24 U/L (ref 15–41)
Albumin: 2.7 g/dL — ABNORMAL LOW (ref 3.5–5.0)
Alkaline Phosphatase: 187 U/L — ABNORMAL HIGH (ref 38–126)
Anion gap: 11 (ref 5–15)
BUN: 14 mg/dL (ref 6–20)
CO2: 18 mmol/L — ABNORMAL LOW (ref 22–32)
Calcium: 9.3 mg/dL (ref 8.9–10.3)
Chloride: 104 mmol/L (ref 98–111)
Creatinine, Ser: 0.78 mg/dL (ref 0.44–1.00)
GFR, Estimated: >60 mL/min (ref >60)
Glucose, Bld: 179 mg/dL — ABNORMAL HIGH (ref 70–99)
Potassium: 4.1 mmol/L (ref 3.5–5.1)
Sodium: 133 mmol/L — ABNORMAL LOW (ref 135–145)
Total Bilirubin: 0.4 mg/dL (ref 0.3–1.2)
Total Protein: 6.9 g/dL (ref 6.5–8.1)

## 2020-12-21 LAB — CBC
HCT: 37.5 % (ref 36.0–46.0)
Hemoglobin: 12 g/dL (ref 12.0–15.0)
MCH: 26.7 pg (ref 26.0–34.0)
MCHC: 32 g/dL (ref 30.0–36.0)
MCV: 83.5 fL (ref 80.0–100.0)
Platelets: 197 10*3/uL (ref 150–400)
RBC: 4.49 MIL/uL (ref 3.87–5.11)
RDW: 14.2 % (ref 11.5–15.5)
WBC: 8.3 10*3/uL (ref 4.0–10.5)
nRBC: 0 % (ref 0.0–0.2)

## 2020-12-21 LAB — GLUCOSE, CAPILLARY
Glucose-Capillary: 104 mg/dL — ABNORMAL HIGH (ref 70–99)
Glucose-Capillary: 105 mg/dL — ABNORMAL HIGH (ref 70–99)
Glucose-Capillary: 128 mg/dL — ABNORMAL HIGH (ref 70–99)
Glucose-Capillary: 130 mg/dL — ABNORMAL HIGH (ref 70–99)
Glucose-Capillary: 131 mg/dL — ABNORMAL HIGH (ref 70–99)
Glucose-Capillary: 155 mg/dL — ABNORMAL HIGH (ref 70–99)
Glucose-Capillary: 179 mg/dL — ABNORMAL HIGH (ref 70–99)
Glucose-Capillary: 95 mg/dL (ref 70–99)
Glucose-Capillary: 97 mg/dL (ref 70–99)

## 2020-12-21 MED ORDER — OXYTOCIN-SODIUM CHLORIDE 30-0.9 UT/500ML-% IV SOLN
1.0000 m[IU]/min | INTRAVENOUS | Status: DC
  Administered 2020-12-21: 21:00:00 2 m[IU]/min via INTRAVENOUS
  Filled 2020-12-21: qty 500

## 2020-12-21 MED ORDER — INSULIN REGULAR(HUMAN) IN NACL 100-0.9 UT/100ML-% IV SOLN
INTRAVENOUS | Status: DC
  Administered 2020-12-21: 18:00:00 1.5 [IU]/h via INTRAVENOUS
  Filled 2020-12-21: qty 100

## 2020-12-21 MED ORDER — LIDOCAINE HCL (PF) 1 % IJ SOLN
30.0000 mL | INTRAMUSCULAR | Status: AC | PRN
  Administered 2020-12-22: 30 mL via SUBCUTANEOUS
  Filled 2020-12-21: qty 30

## 2020-12-21 MED ORDER — MISOPROSTOL 25 MCG QUARTER TABLET: 25.0000 ug | ORAL_TABLET | ORAL | Status: DC | PRN

## 2020-12-21 MED ORDER — LACTATED RINGERS IV SOLN
500.0000 mL | INTRAVENOUS | Status: DC | PRN
  Administered 2020-12-22 (×2): 250 mL via INTRAVENOUS

## 2020-12-21 MED ORDER — FENTANYL CITRATE (PF) 100 MCG/2ML IJ SOLN
50.0000 ug | INTRAMUSCULAR | Status: DC | PRN
  Administered 2020-12-22 (×2): 100 ug via INTRAVENOUS
  Filled 2020-12-21 (×2): qty 2

## 2020-12-21 MED ORDER — DEXTROSE 50 % IV SOLN: 0.0000 mL | INTRAVENOUS | Status: DC | PRN

## 2020-12-21 MED ORDER — OXYCODONE-ACETAMINOPHEN 5-325 MG PO TABS: 1.0000 | ORAL_TABLET | ORAL | Status: DC | PRN

## 2020-12-21 MED ORDER — OXYTOCIN-SODIUM CHLORIDE 30-0.9 UT/500ML-% IV SOLN: 2.5000 [IU]/h | INTRAVENOUS | Status: DC

## 2020-12-21 MED ORDER — LABETALOL HCL 200 MG PO TABS
400.0000 mg | ORAL_TABLET | Freq: Two times a day (BID) | ORAL | Status: DC
  Administered 2020-12-21 – 2020-12-22 (×2): 400 mg via ORAL
  Filled 2020-12-21 (×4): qty 2

## 2020-12-21 MED ORDER — HYDROXYZINE HCL 50 MG PO TABS: 50.0000 mg | ORAL_TABLET | Freq: Four times a day (QID) | ORAL | Status: DC | PRN

## 2020-12-21 MED ORDER — DEXTROSE-NACL 5-0.45 % IV SOLN: INTRAVENOUS | Status: DC

## 2020-12-21 MED ORDER — ONDANSETRON HCL 4 MG/2ML IJ SOLN: 4.0000 mg | Freq: Four times a day (QID) | INTRAMUSCULAR | Status: DC | PRN

## 2020-12-21 MED ORDER — ACETAMINOPHEN 325 MG PO TABS: 650.0000 mg | ORAL_TABLET | ORAL | Status: DC | PRN

## 2020-12-21 MED ORDER — OXYTOCIN BOLUS FROM INFUSION
333.0000 mL | Freq: Once | INTRAVENOUS | Status: AC
  Administered 2020-12-22: 07:00:00 333 mL via INTRAVENOUS

## 2020-12-21 MED ORDER — SODIUM CHLORIDE 0.9 % IV SOLN
5.0000 10*6.[IU] | Freq: Once | INTRAVENOUS | Status: AC
  Administered 2020-12-21: 15:00:00 5 10*6.[IU] via INTRAVENOUS
  Filled 2020-12-21: qty 5

## 2020-12-21 MED ORDER — LACTATED RINGERS IV SOLN: INTRAVENOUS | Status: DC

## 2020-12-21 MED ORDER — MISOPROSTOL 50MCG HALF TABLET
50.0000 ug | ORAL_TABLET | Freq: Once | ORAL | Status: AC
  Administered 2020-12-21: 16:00:00 50 ug via BUCCAL
  Filled 2020-12-21: qty 1

## 2020-12-21 MED ORDER — PENICILLIN G POT IN DEXTROSE 60000 UNIT/ML IV SOLN
3.0000 10*6.[IU] | INTRAVENOUS | Status: DC
  Administered 2020-12-21 – 2020-12-22 (×3): 3 10*6.[IU] via INTRAVENOUS
  Filled 2020-12-21 (×3): qty 50

## 2020-12-21 MED ORDER — TERBUTALINE SULFATE 1 MG/ML IJ SOLN: 0.2500 mg | Freq: Once | INTRAMUSCULAR | Status: DC | PRN

## 2020-12-21 MED ORDER — SOD CITRATE-CITRIC ACID 500-334 MG/5ML PO SOLN
30.0000 mL | ORAL | Status: DC | PRN
  Filled 2020-12-21: qty 15

## 2020-12-21 NOTE — Progress Notes (Signed)
Labor Progress Note Joanna Rogers is a 24 y.o. G1P0 at [redacted]w[redacted]d presented for IOL for cHTN, A2GDM  S: Feeling contractions   O:  BP 125/77   Pulse 80   Temp 97.8 F (36.6 C) (Oral)   Resp 18   Ht 5\' 9"  (1.753 m)   Wt 131.8 kg   LMP 03/30/2020   BMI 42.90 kg/m  EFM: baseline 130/mod variability/pos accels/no decels   CVE: Dilation: 4 Effacement (%): 60 Cervical Position: Posterior Station: -3 Presentation: Vertex Exam by:: Dr. [email or web address]   A&P: 24 y.o. G1P0 [redacted]w[redacted]d here for IOL for cHTN, A2GDM   #Labor: s/p cytotec and FB. Proceed with pitocin at this time. Consider AROM at next exam.  #Pain: Desires unmedicated  #FWB: cat I  #GBS pos, on PCN   #A2GDM: continue Endotool. Most recent CBG 128>131>97  #cHTN: on labetalol 400mg  BID. PEC labs normal on admission. BP has been normal to MR since admission.   [redacted]w[redacted]d, MD 9:14 PM

## 2020-12-21 NOTE — H&P (Signed)
OBSTETRIC ADMISSION HISTORY AND PHYSICAL  Joanna Rogers is a 24 y.o. female G1P0 with IUP at 60w0dby L/18 presenting for IOL secondary to [place] She reports +FMs, No LOF, no VB, no blurry vision, headaches or peripheral edema, and RUQ pain.  She plans on breast feeding. She request POPs for birth control.  She [facility] --->  Estimated Date of Delivery: 01/04/21  Sono:  '@[redacted]w[redacted]d' , CWD, normal anatomy, cephalic presentation, 21610R 82% EFW   Prenatal History/Complications:  -cHTN (labetalol 400 BID) -A2GDM (glyburide 2.5 BID) -asthma (prn albuterol)  Past Medical History: Past Medical History:  Diagnosis Date  . Gestational diabetes   . Hypertension affecting pregnancy     Past Surgical History: Past Surgical History:  Procedure Laterality Date  . NO PAST SURGERIES      Obstetrical History: OB History    Gravida  1   Para      Term      Preterm      AB      Living  0     SAB      IAB      Ectopic      Multiple      Live Births              Social History Social History   Socioeconomic History  . Marital status: Married    Spouse name: Not on file  . Number of children: Not on file  . Years of education: Not on file  . Highest education level: Not on file  Occupational History  . Not on file  Tobacco Use  . Smoking status: Never Smoker  . Smokeless tobacco: Never Used  Vaping Use  . Vaping Use: Never used  Substance and Sexual Activity  . Alcohol use: Not Currently  . Drug use: Not Currently  . Sexual activity: Yes    Birth control/protection: None  Other Topics Concern  . Not on file  Social History Narrative  . Not on file   Social Determinants of Health   Financial Resource Strain: Not on file  Food Insecurity: No Food Insecurity  . Worried About RCharity fundraiserin the Last Year: Never true  . Ran Out of Food in the Last Year: Never true  Transportation Needs: No  Transportation Needs  . Lack of Transportation (Medical): No  . Lack of Transportation (Non-Medical): No  Physical Activity: Not on file  Stress: Not on file  Social Connections: Not on file    Family History: Family History  Problem Relation Age of Onset  . High Cholesterol Mother   . Hypertension Father   . Diabetes Father   . Kidney Stones Father   . Breast cancer Maternal Grandmother   . Cervical cancer Maternal Grandmother   . Hypertension Maternal Grandfather   . Kidney Stones Paternal Grandmother   . Clotting disorder Paternal Grandmother   . Diabetes Paternal Grandfather   . Hypertension Paternal Grandfather     Allergies: Allergies  Allergen Reactions  . Latex Swelling    Facility-Administered Medications Prior to Admission  Medication Dose Route Frequency Provider Last Rate Last Admin  . aspirin chewable tablet 81 mg  81 mg Oral Daily BGriffin Basil MD       Medications Prior to Admission  Medication Sig Dispense Refill Last Dose  . Accu-Chek Softclix Lancets lancets 1 each by Other route 4 (four) times daily. 100 each 12   .  albuterol (PROAIR HFA) 108 (90 Base) MCG/ACT inhaler Inhale 1-2 puffs into the lungs every 6 (six) hours as needed for wheezing or shortness of breath. 18 g 4   . ALBUTEROL IN Inhale into the lungs.     Marland Kitchen aspirin EC 81 MG tablet Take 1 tablet (81 mg total) by mouth daily. Take after 12 weeks for prevention of preeclampsia later in pregnancy (Patient not taking: Reported on 12/15/2020) 300 tablet 2   . aspirin EC 81 MG tablet Take 1 tablet (81 mg total) by mouth daily. Take after 12 weeks for prevention of preeclampsia later in pregnancy 300 tablet 2   . Blood Glucose Monitoring Suppl (ACCU-CHEK GUIDE) w/Device KIT 1 Device by Does not apply route 4 (four) times daily. 1 kit 0   . glucose blood (ACCU-CHEK GUIDE) test strip Use to check blood sugars four times a day was instructed 50 each 12   . glyBURIDE (DIABETA) 2.5 MG tablet Take 1  tablet (2.5 mg total) by mouth 2 (two) times daily with a meal. 60 tablet 3   . labetalol (NORMODYNE) 200 MG tablet Take 2 tablets (400 mg total) by mouth 2 (two) times daily. 90 tablet 3   . omeprazole (PRILOSEC) 10 MG capsule Take 1 capsule (10 mg total) by mouth daily. 30 capsule 2   . Prenatal Vit w/Fe-Methylfol-FA (PNV PO) Take by mouth.     . promethazine (PHENERGAN) 25 MG suppository Place 1 suppository (25 mg total) rectally every 6 (six) hours as needed for nausea. (Patient not taking: Reported on 11/01/2020) 12 suppository 1      Review of Systems   All systems reviewed and negative except as stated in HPI  Last menstrual period 03/30/2020. General appearance: alert, cooperative and appears stated age Lungs: normal WOB Heart: regular rate Abdomen: soft, non-tender Extremities: no sign of DVT Presentation: cephalic on bedside ultrasound Fetal monitoringBaseline: 145 bpm, Variability: Good {> 6 bpm), Accelerations: Reactive and Decelerations: Absent Uterine activity: frequency ctx q2-4 min    Prenatal labs: ABO, Rh: B/Positive/-- (07/06 1610) Antibody: Negative (07/06 1610) Rubella: 2.12 (07/06 1610) RPR: Non Reactive (10/11 1345)  HBsAg: Negative (07/06 1610)  HIV: Non Reactive (10/11 1345)  GBS: Positive/-- (12/15 0343)  1 hr Glucola: 100/165/158 Genetic screening  wnl Anatomy US wnl  Prenatal Transfer Tool  Maternal Diabetes: Yes:  Diabetes Type:  Insulin/Medication controlled Genetic Screening: Normal Maternal Ultrasounds/Referrals: Normal Fetal Ultrasounds or other Referrals:  Fetal echo, Referred to Materal Fetal Medicine  Maternal Substance Abuse:  No Significant Maternal Medications:  Meds include: Other: labetalol, glyburide Significant Maternal Lab Results: Group B Strep positive  No results found for this or any previous visit (from the past 24 hour(s)).  Patient Active Problem List   Diagnosis Date Noted  . Chronic hypertension in obstetric context  in third trimester 12/21/2020  . Gestational diabetes mellitus (GDM), antepartum 08/05/2020  . Chronic hypertension in obstetric context in second trimester 07/05/2020  . Asthma affecting pregnancy in second trimester 07/05/2020  . Supervision of high risk pregnancy, antepartum 06/29/2020    Assessment/Plan:  Joanna Rogers is a 24 y.o. G1P0 at 64w0dhere for IOL secondary to c[place]  #Labor: FB and cytotec placed on admission. Plan to transition to pitocin as clinically indicated. #Pain: TBD per pt request #FWB: Category 1 strip #ID: GBS positive > PCN on admission #MOF: breast #MOC: POPs #Circ: desired #cHTN: labetolol 4023mBID in pregnancy; continued on admission. Asymptomatic. Preeclampsia labs ordered on admission. #A2GDM:  glyburide 2.19m BID in pregnancy; given elevated BG levels on admission Endotool was initiated. #Asthma: prn albuterol. Will avoid Hemabate.   GRanda Ngo MD OB Fellow, Faculty Practice 12/21/2020 8:53 PM

## 2020-12-21 NOTE — Progress Notes (Signed)
Spoke to patient informed we will call her to come in for induction as soon as possible

## 2020-12-22 ENCOUNTER — Encounter (HOSPITAL_COMMUNITY): Payer: Self-pay | Admitting: Obstetrics and Gynecology

## 2020-12-22 DIAGNOSIS — Z3A38 38 weeks gestation of pregnancy: Secondary | ICD-10-CM

## 2020-12-22 LAB — GLUCOSE, CAPILLARY
Glucose-Capillary: 97 mg/dL (ref 70–99)
Glucose-Capillary: 108 mg/dL — ABNORMAL HIGH (ref 70–99)
Glucose-Capillary: 134 mg/dL — ABNORMAL HIGH (ref 70–99)
Glucose-Capillary: 190 mg/dL — ABNORMAL HIGH (ref 70–99)
Glucose-Capillary: 159 mg/dL — ABNORMAL HIGH (ref 70–99)

## 2020-12-22 LAB — RPR: RPR Ser Ql: NONREACTIVE

## 2020-12-22 MED ORDER — ACETAMINOPHEN 325 MG PO TABS: 650.0000 mg | ORAL_TABLET | ORAL | Status: DC | PRN

## 2020-12-22 MED ORDER — IBUPROFEN 600 MG PO TABS
600.0000 mg | ORAL_TABLET | Freq: Four times a day (QID) | ORAL | Status: DC
  Administered 2020-12-22 – 2020-12-24 (×9): 600 mg via ORAL
  Filled 2020-12-22 (×10): qty 1

## 2020-12-22 MED ORDER — WITCH HAZEL-GLYCERIN EX PADS: 1.0000 "application " | MEDICATED_PAD | CUTANEOUS | Status: DC | PRN

## 2020-12-22 MED ORDER — DIPHENHYDRAMINE HCL 25 MG PO CAPS: 25.0000 mg | ORAL_CAPSULE | Freq: Four times a day (QID) | ORAL | Status: DC | PRN

## 2020-12-22 MED ORDER — PRENATAL MULTIVITAMIN CH
1.0000 | ORAL_TABLET | Freq: Every day | ORAL | Status: DC
  Administered 2020-12-22 – 2020-12-24 (×3): 1 via ORAL
  Filled 2020-12-22 (×3): qty 1

## 2020-12-22 MED ORDER — SENNOSIDES-DOCUSATE SODIUM 8.6-50 MG PO TABS
2.0000 | ORAL_TABLET | ORAL | Status: DC
  Administered 2020-12-22 – 2020-12-24 (×3): 2 via ORAL
  Filled 2020-12-22 (×3): qty 2

## 2020-12-22 MED ORDER — SIMETHICONE 80 MG PO CHEW: 80.0000 mg | CHEWABLE_TABLET | ORAL | Status: DC | PRN

## 2020-12-22 MED ORDER — DIBUCAINE (PERIANAL) 1 % EX OINT: 1.0000 "application " | TOPICAL_OINTMENT | CUTANEOUS | Status: DC | PRN

## 2020-12-22 MED ORDER — LIDOCAINE HCL (PF) 1 % IJ SOLN
INTRAMUSCULAR | Status: AC
  Filled 2020-12-22: qty 30

## 2020-12-22 MED ORDER — TETANUS-DIPHTH-ACELL PERTUSSIS 5-2.5-18.5 LF-MCG/0.5 IM SUSY
0.5000 mL | PREFILLED_SYRINGE | Freq: Once | INTRAMUSCULAR | Status: DC
Start: 2020-12-23 — End: 2020-12-24

## 2020-12-22 MED ORDER — COCONUT OIL OIL: 1.0000 "application " | TOPICAL_OIL | Status: DC | PRN

## 2020-12-22 MED ORDER — ONDANSETRON HCL 4 MG PO TABS: 4.0000 mg | ORAL_TABLET | ORAL | Status: DC | PRN

## 2020-12-22 MED ORDER — BENZOCAINE-MENTHOL 20-0.5 % EX AERO
1.0000 "application " | INHALATION_SPRAY | CUTANEOUS | Status: DC | PRN
  Administered 2020-12-22 – 2020-12-23 (×2): 1 via TOPICAL
  Filled 2020-12-22 (×2): qty 56

## 2020-12-22 MED ORDER — ZOLPIDEM TARTRATE 5 MG PO TABS: 5.0000 mg | ORAL_TABLET | Freq: Every evening | ORAL | Status: DC | PRN

## 2020-12-22 MED ORDER — ONDANSETRON HCL 4 MG/2ML IJ SOLN: 4.0000 mg | INTRAMUSCULAR | Status: DC | PRN

## 2020-12-22 NOTE — Lactation Note (Signed)
This note was copied from a baby's chart. Lactation Consultation Note  Patient Name: Joanna Rogers YIRSW'N Date: 12/22/2020 Reason for consult: Follow-up assessment;Difficult latch;Primapara Age:25 hours  [name] Joanna Rogers at breast when LC came in.  RN assisted with mom with latch. Mom using 16 mm nipple shield at this time.  Infant suckles a few times and falls asleep and suckles a few times.  LC assist with trying to stimulate him to keep him awake.  He continued to suckle off and on with no swallows noted.  He did this off and on and then fell asleep.  LC left him sleeping and went to get DEBP.  Came back and mom being assessed.  LC Left and came back and mom on telephone.  Let dad know LC would be  Back in a few minutes to get mom pumping. LC returned about 10 minutes later to initiate pumping with DEBP. Urged mom to prepump with manual and post pump with electric. Urged to feed on cue and at least 8-12 or more times Call Lactation as needed. Maternal Data    Feeding Feeding Type: Breast Fed  LATCH Score Latch: Repeated attempts needed to sustain latch, nipple held in mouth throughout feeding, stimulation needed to elicit sucking reflex.  Audible Swallowing: None  Type of Nipple: Everted at rest and after stimulation (everted but short)  Comfort (Breast/Nipple): Soft / non-tender  Hold (Positioning): Assistance needed to correctly position infant at breast and maintain latch.  LATCH Score: 6  Interventions Interventions: Breast feeding basics reviewed;Assisted with latch;Hand express;Pre-pump if needed;Breast massage;Hand pump;DEBP  Lactation Tools Discussed/Used Pump Education: Setup, frequency, and cleaning Initiated by:: hs Date initiated:: 12/22/20   Consult Status Consult Status: Follow-up Date: 12/23/20 Follow-up type: In-patient    [facility] Michaelle Copas 12/22/2020, 4:50 PM

## 2020-12-22 NOTE — Lactation Note (Addendum)
This note was copied from a baby's chart. Lactation Consultation Note  Patient Name: Joanna Rogers EXNTZ'G Date: 12/22/2020 Reason for consult: Initial assessment;1st time breastfeeding;Primapara (L&D)   Returned to room as requested by RN.  RN stated baby had recently latched for a few min. RN stated baby is grunting. MD aware.   Baby STS at breast when [facility] entered room.  Assisted with latching baby. Mother has short shaft nipples.  Reviewed how to compress breast to achieve increased depth.    Maternal Data    Feeding Feeding Type: Breast Fed  LATCH Score Latch: Repeated attempts needed to sustain latch, nipple held in mouth throughout feeding, stimulation needed to elicit sucking reflex.  Audible Swallowing: None  Type of Nipple: Everted at rest and after stimulation (short shaft)  Comfort (Breast/Nipple): Soft / non-tender  Hold (Positioning): Assistance needed to correctly position infant at breast and maintain latch.  LATCH Score: 6  Interventions Interventions: Assisted with latch;Skin to skin  Lactation Tools Discussed/Used     Consult Status Consult Status: Follow-up Date: 12/22/20 Follow-up type: In-patient    Dahlia Byes [facility] 12/22/2020, 8:36 AM

## 2020-12-22 NOTE — Lactation Note (Signed)
This note was copied from a baby's chart. Lactation Consultation Note  Patient Name: Joanna Rogers Date: 12/22/2020   RN stated LC should come back in approx 30 min.  Mother is having a repair and baby is transitioning. LC will follow up.     Maternal Data    Feeding    LATCH Score                   Interventions    Lactation Tools Discussed/Used     Consult Status      [name] 12/22/2020, 7:56 AM

## 2020-12-22 NOTE — Progress Notes (Signed)
Inpatient Diabetes Program Recommendations  AACE/ADA: New Consensus Statement on Inpatient Glycemic Control (2015)  Target Ranges:  Prepandial:   less than 140 mg/dL      Peak postprandial:   less than 180 mg/dL (1-2 hours)      Critically ill patients:  140 - 180 mg/dL   Lab Results  Component Value Date   GLUCAP 159 (H) 12/22/2020   HGBA1C 6.6 (H) 07/05/2020    Review of Glycemic Control Results for Joanna Rogers, Joanna Rogers (MRN [id number]) as of 12/22/2020 08:56  Ref. Range 12/22/2020 01:33 12/22/2020 03:37 12/22/2020 05:32 12/22/2020 07:06 12/22/2020 08:07  Glucose-Capillary Latest Ref Range: 70 - 99 mg/dL 97 235 (H) 361 (H) 443 (H) 159 (H)   Diabetes history:  GDM Outpatient Diabetes medications:  Glyburide 2.5 mg  BID  Inpatient Diabetes Program Recommendations:     Please consider Glycemic Control order set: Novolog 0-9 units TID   Will continue to follow while inpatient.  Thank you, Dulce Sellar, RN, BSN Diabetes Coordinator Inpatient Diabetes Program [phone] (team pager from 8a-5p)

## 2020-12-22 NOTE — Progress Notes (Signed)
Labor Progress Note Joanna Rogers is a 24 y.o. G1P0 at [redacted]w[redacted]d presented for IOL for cHTN, A2GDM  S: Feeling contractions more intensely   O:  BP (!) 152/88   Pulse 71   Temp 98.2 F (36.8 C) (Oral)   Resp 18   Ht 5\' 9"  (1.753 m)   Wt 131.8 kg   LMP 03/30/2020   BMI 42.90 kg/m  EFM: baseline 130/mod variability/pos accels/no decels   CVE: Dilation: 5 Effacement (%): 80 Cervical Position: Posterior Station: -2 Presentation: Vertex Exam by:: Dr. [email or web address]   A&P: 24 y.o. G1P0 [redacted]w[redacted]d here for IOL for cHTN, A2GDM   #Labor: s/p cytotec and FB. Pitocin started 2100. AROM at 0200 for blood tinged fluid. Monitor bleeding. Continue to titrate pit per protocol.  #Pain: Desires unmedicated  #FWB: cat I  #GBS pos, on PCN   #A2GDM: continue Endotool. Most recent CBG 95>105>97 #cHTN: on labetalol 400mg  BID. PEC labs normal on admission. BP has been normal to MR since admission.   2101, MD 2:29 AM

## 2020-12-22 NOTE — Discharge Summary (Signed)
Postpartum Discharge Summary    Patient Name: Joanna Rogers DOB: [birth year] MRN: [id number]  Date of admission: 12/21/2020 Delivery date:12/22/2020  Delivering provider: Donnamae Jude  Date of discharge: 12/24/2020  Admitting diagnosis: Chronic hypertension in obstetric context in third trimester [O10.913] Intrauterine pregnancy: [redacted]w[redacted]d    Secondary diagnosis:  Active Problems:   Asthma affecting pregnancy in second trimester   Gestational diabetes mellitus (GDM), antepartum   Chronic hypertension in obstetric context in third trimester   Vaginal delivery  Additional problems: Gestational diabetes.    Discharge diagnosis: Term Pregnancy Delivered, Gestational Hypertension and GDM A2                                              Post partum procedures:none Augmentation: AROM, Pitocin, Cytotec and IP Foley Complications: None  Hospital course: Induction of Labor With Vaginal Delivery   24y.o. yo G1P0 at 367w1das admitted to the hospital 12/21/2020 for induction of labor.  Indication for induction: Gestational hypertension and A2 DM.  Patient had an uncomplicated labor course as follows: Membrane Rupture Time/Date: 2:02 AM ,12/22/2020   Delivery Method:Vaginal, Spontaneous  Episiotomy: None  Lacerations:  Sulcus  Details of delivery can be found in separate delivery note.  Patient had a routine postpartum course. Patient is discharged home 12/24/20. Pt did not require any blood pressure medication on discharge. Plan for 1 week blood pressure check in clinic.  Newborn Data: Birth date:12/22/2020  Birth time:6:56 AM  Gender:Female  Living status:Living  Apgars:6 ,8  Weight:3690 g   Magnesium Sulfate received: No BMZ received: No Rhophylac:N/A MMR:N/A T-DaP:offered prior to discharge Flu: offered prior to discharge Transfusion:No  Physical exam  Vitals:   12/23/20 0520 12/23/20 2015 12/23/20 2249 12/24/20 0513  BP: (!) 107/51 (!) 150/84 122/68 125/79  Pulse: 74 99   93  Resp: '18 18  18  ' Temp: 97.8 F (36.6 C) 98.1 F (36.7 C)  98.2 F (36.8 C)  TempSrc: Oral Oral  Oral  SpO2: 99% 99%  99%  Weight:      Height:       General: alert, cooperative and no distress Lochia: appropriate Uterine Fundus: firm Incision: N/A DVT Evaluation: No evidence of DVT seen on physical exam. No cords or calf tenderness. No significant calf/ankle edema. Labs: Lab Results  Component Value Date   WBC 8.3 12/21/2020   HGB 12.0 12/21/2020   HCT 37.5 12/21/2020   MCV 83.5 12/21/2020   PLT 197 12/21/2020   CMP Latest Ref Rng & Units 12/23/2020  Glucose 70 - 99 mg/dL 106(H)  BUN 6 - 20 mg/dL -  Creatinine 0.44 - 1.00 mg/dL -  Sodium 135 - 145 mmol/L -  Potassium 3.5 - 5.1 mmol/L -  Chloride 98 - 111 mmol/L -  CO2 22 - 32 mmol/L -  Calcium 8.9 - 10.3 mg/dL -  Total Protein 6.5 - 8.1 g/dL -  Total Bilirubin 0.3 - 1.2 mg/dL -  Alkaline Phos 38 - 126 U/L -  AST 15 - 41 U/L -  ALT 0 - 44 U/L -   Edinburgh Score: Edinburgh Postnatal Depression Scale Screening Tool 12/23/2020  I have been able to laugh and see the funny side of things. 0  I have looked forward with enjoyment to things. 0  I have blamed myself unnecessarily when things went wrong. 0  I have been anxious or worried for no good reason. 0  I have felt scared or panicky for no good reason. 0  Things have been getting on top of me. 0  I have been so unhappy that I have had difficulty sleeping. 0  I have felt sad or miserable. 0  I have been so unhappy that I have been crying. 0  The thought of harming myself has occurred to me. 0  Edinburgh Postnatal Depression Scale Total 0      After visit meds:  Allergies as of 12/24/2020      Reactions   Latex Swelling      Medication List    STOP taking these medications   Accu-Chek Guide test strip Generic drug: glucose blood   Accu-Chek Guide w/Device Kit   Accu-Chek Softclix Lancets lancets   ALBUTEROL IN   aspirin EC 81 MG tablet    glyBURIDE 2.5 MG tablet Commonly known as: DIABETA   labetalol 200 MG tablet Commonly known as: NORMODYNE   omeprazole 10 MG capsule Commonly known as: PRILOSEC   promethazine 25 MG suppository Commonly known as: Phenergan     TAKE these medications   acetaminophen 325 MG tablet Commonly known as: Tylenol Take 2 tablets (650 mg total) by mouth every 6 (six) hours as needed for mild pain, moderate pain, fever or headache (for pain scale < 4).   albuterol 108 (90 Base) MCG/ACT inhaler Commonly known as: ProAir HFA Inhale 1-2 puffs into the lungs every 6 (six) hours as needed for wheezing or shortness of breath.   coconut oil Oil Apply 1 application topically as needed (nipple pain).   ibuprofen 600 MG tablet Commonly known as: ADVIL Take 1 tablet (600 mg total) by mouth every 8 (eight) hours as needed for moderate pain or cramping.   norethindrone 0.35 MG tablet Commonly known as: Ortho Micronor Take 1 tablet (0.35 mg total) by mouth daily.   PNV PO Take by mouth.        Discharge home in stable condition Infant Feeding: Breast Infant Disposition:home with mother Discharge instruction: per After Visit Summary and Postpartum booklet. Activity: Advance as tolerated. Pelvic rest for 6 weeks.  Diet: routine diet Anticipated Birth Control: POPs Postpartum Appointment:6 weeks Additional Postpartum F/U: 2 hour GTT and BP check 1 week Future Appointments:No future appointments. Follow up Visit: Message sent to [facility] clinic on 12/24 to schedule PP appt.   Randa Ngo, MD OB Fellow, Faculty Practice 12/24/2020 8:23 AM

## 2020-12-23 DIAGNOSIS — J45909 Unspecified asthma, uncomplicated: Secondary | ICD-10-CM

## 2020-12-23 DIAGNOSIS — O9953 Diseases of the respiratory system complicating the puerperium: Secondary | ICD-10-CM

## 2020-12-23 DIAGNOSIS — O24439 Gestational diabetes mellitus in the puerperium, unspecified control: Secondary | ICD-10-CM

## 2020-12-23 DIAGNOSIS — O1093 Unspecified pre-existing hypertension complicating the puerperium: Secondary | ICD-10-CM

## 2020-12-23 LAB — GLUCOSE, RANDOM: Glucose, Bld: 106 mg/dL — ABNORMAL HIGH (ref 70–99)

## 2020-12-23 LAB — GLUCOSE, CAPILLARY: Glucose-Capillary: 93 mg/dL (ref 70–99)

## 2020-12-23 NOTE — Progress Notes (Signed)
Patient ID: Joanna Rogers, female   DOB: May 20, 1996, 24 y.o.   MRN: [id number] POSTPARTUM PROGRESS NOTE  Post Partum Day 1  Subjective:  Joanna Rogers is a 24 y.o. G1P1001 s/p SVD at [redacted]w[redacted]d.  No acute events overnight.  Pt denies problems with ambulating, voiding or po intake.  She denies nausea or vomiting.  Pain is well controlled.  She has had flatus. She has not had bowel movement.  Lochia Moderate.   Objective: Blood pressure (!) 107/51, pulse 74, temperature 97.8 F (36.6 C), temperature source Oral, resp. rate 18, height 5\' 9"  (1.753 m), weight 131.8 kg, last menstrual period 03/30/2020, SpO2 99 %, unknown if currently breastfeeding.  Physical Exam:  General: alert, cooperative and no distress Chest: no respiratory distress Heart:regular rate, distal pulses intact Abdomen: soft, nontender,  Uterine Fundus: firm, appropriately tender DVT Evaluation: No calf swelling or tenderness Extremities: No edema Skin: warm, dry;  Recent Labs    12/21/20 1446  HGB 12.0  HCT 37.5    Assessment/Plan: Joanna Rogers is a 24 y.o. G1P1001 s/p SVD at [redacted]w[redacted]d, T2DM, cHTN stable  PPD#1 - Doing well Contraception: PoPs Feeding: Breast Dispo: Plan for discharge tomorrow.   LOS: 2 days   [redacted]w[redacted]d, CNM, CNM 12/23/2020, 11:37 AM

## 2020-12-23 NOTE — Lactation Note (Signed)
This note was copied from a baby's chart. Lactation Consultation Note  Patient Name: Joanna Rogers [id number] Date: 12/23/2020  P1, ETI infant with -2% weight loss. Age:25 hours Per mom, she has been latching infant but he only latches for 6 minutes or less and falls asleep she is using 20 mm NS and would like help with the next feeding. Infant is being breastfeed and afterwards supplementing infant  with formula. Infant has cephalohematoma on head and per MGM, infant has  bruising on his arm.  Infant's was in basinet sleeping on billi lights. Mom will call LC when infant is cuing and ready to latch for the next feeding, [facility] written name on white board.  Maternal Data    Feeding Feeding Type: Bottle Fed - Formula  LATCH Score                   Interventions    Lactation Tools Discussed/Used     Consult Status      Danelle Earthly 12/23/2020, 7:41 PM

## 2020-12-24 MED ORDER — NORETHINDRONE 0.35 MG PO TABS: 1.0000 | ORAL_TABLET | Freq: Every day | ORAL | 6 refills | Status: AC

## 2020-12-24 MED ORDER — ACETAMINOPHEN 325 MG PO TABS: 650.0000 mg | ORAL_TABLET | Freq: Four times a day (QID) | ORAL | Status: AC | PRN

## 2020-12-24 MED ORDER — IBUPROFEN 600 MG PO TABS
600.0000 mg | ORAL_TABLET | Freq: Three times a day (TID) | ORAL | 0 refills | Status: AC | PRN
Start: 2020-12-24 — End: ?

## 2020-12-24 MED ORDER — COCONUT OIL OIL: 1.0000 "application " | TOPICAL_OIL | 0 refills | Status: AC | PRN

## 2020-12-24 NOTE — Lactation Note (Signed)
This note was copied from a baby's chart. Lactation Consultation Note  Patient Name: Joanna Rogers [id number] Date: 12/24/2020   Attempted LC visit to 58 hours old infant. Mother is sleeping at the time of visit.  LC will come back to room at another time as possible.     [name] 12/24/2020, 8:48 AM

## 2020-12-24 NOTE — Lactation Note (Signed)
This note was copied from a baby's chart. Lactation Consultation Note  Patient Name: Joanna Rogers RCBUL'A Date: 12/24/2020 Reason for consult: Follow-up assessment Age:25 hours  Follow up 52 hours old infant with 5.15% weight loss at the time of visit. Mother states infant was been eating well. Mother states she has been supplementing with formula and plans to breastfeed using a nipple shield. Mother has a DEBP in room but has not used it. Infant is taking 30-40 mL of formula.   Feeding plan:  1. Breastfeed following hunger cues.  2. Keep infant awake during breastfeeding session: massaging breast, infant's hand/shoulder/feet 3. Offer breast 8 - 12 times in 24h period to establish good milk supply.   4. Pump or hand-express and offer EBM/formula following guidelines, paced bottle feeding and fullness cues.   5. Monitor voids and stools as signs good intake 6. Encouraged maternal rest, hydration and food intake.  7. Contact Lactation Services or local resources for support, questions or concerns.    All questions answered at this time. Parents explain they are waiting for bili results to potentially go home.    Feeding Feeding Type: Bottle Fed - Formula Nipple Type: Slow - flow  Interventions Interventions: Breast feeding basics reviewed;DEBP;Expressed milk  Consult Status Consult Status: Complete Date: 12/24/20 Follow-up type: Call as needed    Ashlin Hidalgo A Higuera Ancidey 12/24/2020, 11:47 AM

## 2020-12-26 LAB — SURGICAL PATHOLOGY

## 2021-02-06 ENCOUNTER — Ambulatory Visit (INDEPENDENT_AMBULATORY_CARE_PROVIDER_SITE_OTHER): Payer: Medicaid Other | Admitting: Obstetrics and Gynecology

## 2021-02-06 ENCOUNTER — Encounter: Payer: Self-pay | Admitting: Obstetrics and Gynecology

## 2021-02-06 ENCOUNTER — Other Ambulatory Visit: Payer: Medicaid Other

## 2021-02-06 ENCOUNTER — Other Ambulatory Visit: Payer: Self-pay

## 2021-02-06 MED ORDER — LISINOPRIL 5 MG PO TABS: 5.0000 mg | ORAL_TABLET | Freq: Every day | ORAL | 3 refills | Status: AC

## 2021-02-06 MED ORDER — METFORMIN HCL 1000 MG PO TABS: 1000.0000 mg | ORAL_TABLET | Freq: Two times a day (BID) | ORAL | 3 refills | Status: AC

## 2021-02-06 NOTE — Progress Notes (Signed)
Post Partum Visit Note  Joanna Rogers is a 25 y.o. G89P1001 female who presents for a postpartum visit. She is 6 weeks postpartum following a normal spontaneous vaginal delivery.  I have fully reviewed the prenatal and intrapartum course. The delivery was at 39 gestational weeks.  Anesthesia: local. Postpartum course has been uncomplicated. Baby is doing well. Baby is feeding by both breast and bottle - [employer] Good Start. Bleeding no bleeding. Bowel function is normal. Bladder function is normal. Patient is not sexually active. Contraception method is abstinence. Postpartum depression screening: negative.   The pregnancy intention screening data noted above was reviewed. Potential methods of contraception were discussed. The patient elected to proceed with Oral Contraceptive.    Edinburgh Postnatal Depression Scale - 02/06/21 0834      Edinburgh Postnatal Depression Scale:  In the Past 7 Days   I have been able to laugh and see the funny side of things. 0    I have looked forward with enjoyment to things. 0    I have blamed myself unnecessarily when things went wrong. 0    I have been anxious or worried for no good reason. 0    I have felt scared or panicky for no good reason. 0    Things have been getting on top of me. 0    I have been so unhappy that I have had difficulty sleeping. 2    I have felt sad or miserable. 0    I have been so unhappy that I have been crying. 0    The thought of harming myself has occurred to me. 0    Edinburgh Postnatal Depression Scale Total 2               Review of Systems Pertinent items noted in HPI and remainder of comprehensive ROS otherwise negative.    Objective:  BP (!) 138/91   Pulse 81   Ht 5\' 9"  (1.753 m)   Wt 262 lb 6.4 oz (119 kg)   BMI 38.75 kg/m    General:  alert, cooperative and no distress   Breasts:  inspection negative, no nipple discharge or bleeding, no masses or nodularity palpable  Lungs: clear to auscultation  bilaterally  Heart:  regular rate and rhythm  Abdomen: soft, non-tender; bowel sounds normal; no masses,  no organomegaly   Vulva:  normal  Vagina: normal vagina, no discharge, exudate, lesion, or erythema        Assessment:    Normal postpartum exam. Pap smear not done at today's visit.   Plan:   Essential components of care per ACOG recommendations:  1.  Mood and well being: Patient with negative depression screening today. Reviewed local resources for support.    2. Infant care and feeding:  -Patient currently breastmilk feeding? Yes Reviewed importance of draining breast regularly to support lactation. -Social determinants of health (SDOH) reviewed in EPIC. No concerns  3. Sexuality, contraception and birth spacing - Patient does not want a pregnancy in the next year.    - Reviewed forms of contraception in tiered fashion. Patient desired oral progesterone-only contraceptive today.   - Discussed birth spacing of 18 months  4. Sleep and fatigue -Encouraged family/partner/community support of 4 hrs of uninterrupted sleep to help with mood and fatigue  5. Physical Recovery  - Discussed patients delivery and complications - Patient had a sulcus laceration, perineal healing reviewed. Patient expressed understanding - Patient has urinary incontinence? No  - Patient is safe  to resume physical and sexual activity  6.  Health Maintenance - Last pap smear done 06/2020 and was normal with negative HPV.   7. Chronic Disease - Patient with GDMA2- 2 hour glucola today. Patient will be notified of results - Patient with history of CHTN, previously on Lysinopril - PCP follow up  Catalina Antigua, MD [facility] [employer], [facility]

## 2021-02-06 NOTE — Addendum Note (Signed)
Addended by: Catalina Antigua on: 02/06/2021 08:59 AM   Modules accepted: Orders

## 2021-02-07 LAB — GLUCOSE TOLERANCE, 2 HOURS
Glucose, 2 hour: 209 mg/dL — ABNORMAL HIGH (ref 65–139)
Glucose, GTT - Fasting: 126 mg/dL — ABNORMAL HIGH (ref 65–99)

## 2021-02-22 ENCOUNTER — Other Ambulatory Visit: Payer: Self-pay | Admitting: Obstetrics and Gynecology

## 2021-03-01 ENCOUNTER — Telehealth: Payer: Self-pay | Admitting: *Deleted

## 2021-03-01 NOTE — Telephone Encounter (Signed)
Refill request from pharmacy for Proair hfa inhaler.   Please send refills if approved.  [facility] [address]

## 2021-04-21 ENCOUNTER — Other Ambulatory Visit: Payer: Self-pay | Admitting: Obstetrics and Gynecology

## 2021-04-21 DIAGNOSIS — O24419 Gestational diabetes mellitus in pregnancy, unspecified control: Secondary | ICD-10-CM

## 2021-06-13 DIAGNOSIS — I10 Essential (primary) hypertension: Secondary | ICD-10-CM | POA: Insufficient documentation

## 2021-06-13 DIAGNOSIS — E119 Type 2 diabetes mellitus without complications: Secondary | ICD-10-CM | POA: Insufficient documentation

## 2021-07-19 DIAGNOSIS — E538 Deficiency of other specified B group vitamins: Secondary | ICD-10-CM | POA: Insufficient documentation

## 2021-11-28 ENCOUNTER — Ambulatory Visit (INDEPENDENT_AMBULATORY_CARE_PROVIDER_SITE_OTHER): Payer: Medicaid Other

## 2021-11-28 ENCOUNTER — Other Ambulatory Visit: Payer: Self-pay

## 2021-11-28 ENCOUNTER — Encounter: Payer: Self-pay | Admitting: Podiatry

## 2021-11-28 ENCOUNTER — Ambulatory Visit: Payer: Medicaid Other | Admitting: Podiatry

## 2021-11-28 DIAGNOSIS — R2242 Localized swelling, mass and lump, left lower limb: Secondary | ICD-10-CM | POA: Diagnosis not present

## 2021-11-29 NOTE — Progress Notes (Signed)
  Subjective:  Patient ID: Joanna Rogers, female    DOB: 22-May-1996,  MRN: [id number]  Chief Complaint  Patient presents with   Cyst    Diagnosis: Nodule of left foot, evaluation for removal of cyst  Referring Provider: MCVEY, ELIZABETH WHITNEY      25 y.o. female presents with the above complaint. History confirmed with patient.  She has a lump on the outside of the left foot that has been worsening since she had pregnancy in the last year  Objective:  Physical Exam: warm, good capillary refill, no trophic changes or ulcerative lesions, normal DP and PT pulses, and normal sensory exam. Left Foot: Palpable fluctuant area likely consistent with ganglion cyst over fourth metatarsal base  Radiographs: Multiple views x-ray of the left foot: no fracture, dislocation, swelling or degenerative changes noted Assessment:   1. Subcutaneous mass of left foot      Plan:  Patient was evaluated and treated and all questions answered.  Suspect she has a ganglion cyst.  We discussed treatment of these including injection or aspiration and excision.  I recommend an MRI to evaluate these prior to doing this.  This has been ordered and I will see her back after the MRI for further treatment and  Return for call to schedule after MRI to review.

## 2021-12-26 ENCOUNTER — Other Ambulatory Visit: Payer: Medicaid Other

## 2022-01-11 ENCOUNTER — Other Ambulatory Visit: Payer: Medicaid Other

## 2022-01-20 ENCOUNTER — Ambulatory Visit
Admission: RE | Admit: 2022-01-20 | Discharge: 2022-01-20 | Disposition: A | Discharge status: Home / Self Care | Payer: Medicaid Other | Source: Ambulatory Visit | Attending: Podiatry | Admitting: Podiatry

## 2022-01-20 ENCOUNTER — Other Ambulatory Visit: Payer: Self-pay

## 2022-01-20 DIAGNOSIS — R2242 Localized swelling, mass and lump, left lower limb: Secondary | ICD-10-CM

## 2022-01-20 MED ORDER — GADOBENATE DIMEGLUMINE 529 MG/ML IV SOLN
20.0000 mL | Freq: Once | INTRAVENOUS | Status: AC | PRN
  Administered 2022-01-20: 20 mL via INTRAVENOUS

## 2022-02-06 ENCOUNTER — Other Ambulatory Visit: Payer: Self-pay

## 2022-02-06 ENCOUNTER — Ambulatory Visit: Payer: Medicaid Other | Admitting: Podiatry

## 2022-02-06 DIAGNOSIS — M67472 Ganglion, left ankle and foot: Secondary | ICD-10-CM

## 2022-02-06 NOTE — Progress Notes (Signed)
°  Subjective:  Patient ID: Joanna Rogers, female    DOB: [birth year],  MRN: [id number]  Chief Complaint  Patient presents with   Follow-up    MRI follow up- Patient reports she is still have pain ( left foot) minor swelling has been noticed- further evaluation     26 y.o. female presents with the above complaint. History confirmed with patient.  She has a lump on the outside of the left foot that has been worsening since she had pregnancy in the last year   Interval history since last visit she completed the MRI.  Objective:  Physical Exam: warm, good capillary refill, no trophic changes or ulcerative lesions, normal DP and PT pulses, and normal sensory exam. Left Foot: Palpable fluctuant area likely consistent with ganglion cyst over fourth metatarsal base  Radiographs: Multiple views x-ray of the left foot: no fracture, dislocation, swelling or degenerative changes noted  Study Result  Narrative & Impression  CLINICAL DATA:  Mass along the dorsal aspect of the foot.   EXAM: MRI OF THE LEFT FOREFOOT WITHOUT AND WITH CONTRAST   TECHNIQUE: Multiplanar, multisequence MR imaging of the left forefoot was performed both before and after administration of intravenous contrast.   CONTRAST:  71mL MULTIHANCE GADOBENATE DIMEGLUMINE 529 MG/ML IV SOLN   COMPARISON:  None.   FINDINGS: Bones/Joint/Cartilage   No fracture or dislocation. Normal alignment. No joint effusion. No marrow signal abnormality.   Ligaments   Collateral ligaments are intact. Lisfranc ligament is intact.   Muscles and Tendons   Flexor, peroneal and extensor compartment tendons are intact. Muscles are normal.   Soft tissue 1.3 x 1.1 x 4.1 cm well-defined T2 hyperintense, T1 hypointense, nonenhancing mass along the dorsal aspect of the third and fourth metatarsal bases most consistent with a ganglion cyst. Few thin septations along the distal aspect. No soft tissue mass.   IMPRESSION: 1. A 1.3 x 1.1  x 4.1 cm ganglion cyst along the dorsal aspect of the third and fourth metatarsal bases.     Electronically Signed   By: Elige Ko M.D.   On: 01/22/2022 06:56   Assessment:   1. Ganglion cyst of left foot      Plan:  Patient was evaluated and treated and all questions answered.  I reviewed the results of the MRI with her in detail.  We discussed treatment including aspiration and injection and surgical excision.  Discussed the risks and benefits of all these.  Her A1c is currently elevated I think this may increase her risk of surgical excision.  I recommended drainage and aspiration with injection today.  This was done following a local field block and prepped with Betadine.  4 mg of dexamethasone phosphate was injected in the ganglion cyst after approximate 2 cc of a jellylike material was aspirated from the lesion.  She tolerated it well.  We will reevaluate in 2 months and consider further treatment as needed.  Return in about 2 months (around 04/06/2022) for recheck ganglion cyst .

## 2022-03-26 IMAGING — US US MFM FETAL BPP W/O NON-STRESS
1 series · 14 of 27 positions shown · non-contrast
Comparison: none

[Series 1: us mfm fetal bpp w/o non-stress · 27 acquisitions, 14 frames shown]
[im 1/27]
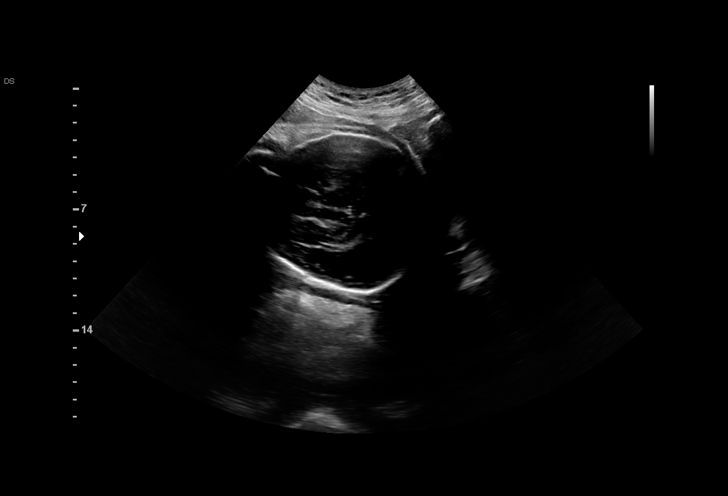
[im 3/27]
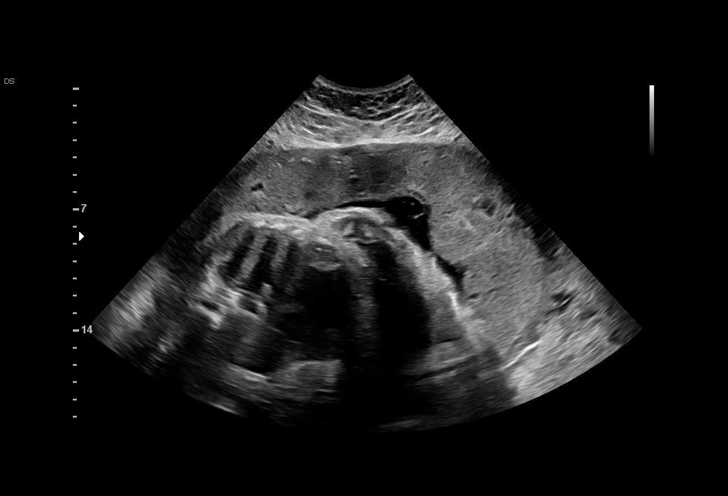
[im 5/27]
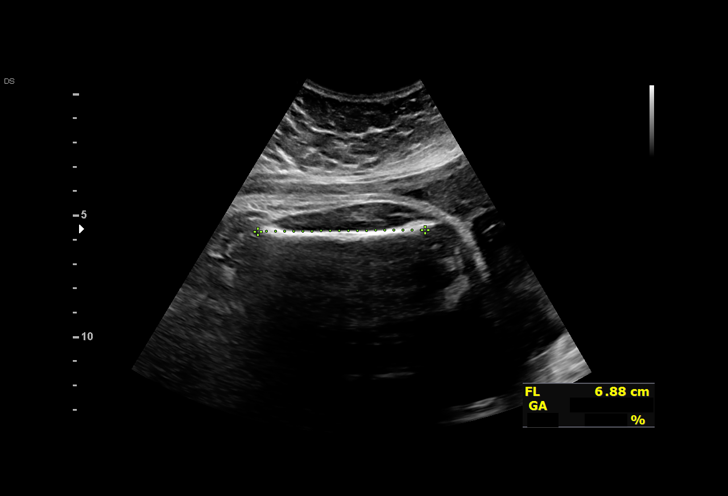
[im 7/27]
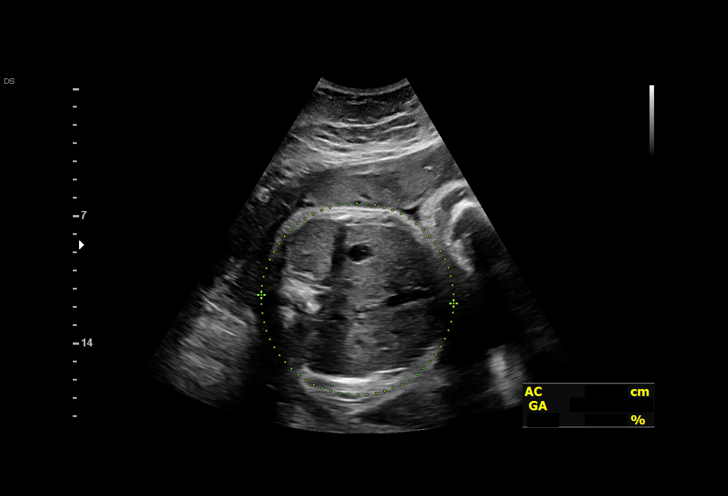
[im 9/27]
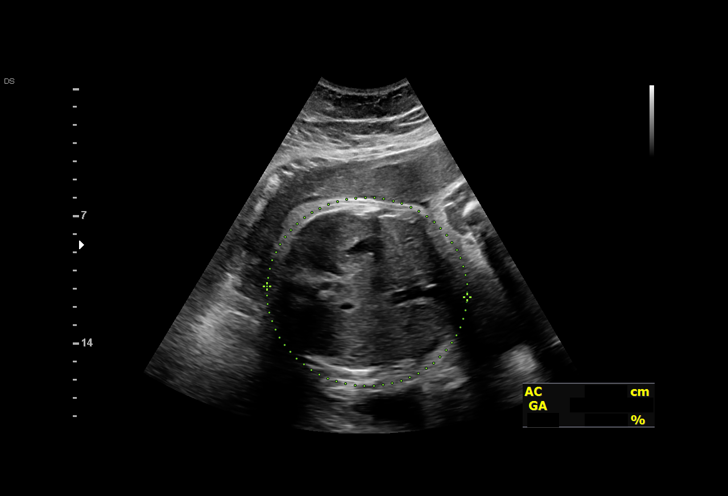
[im 11/27]
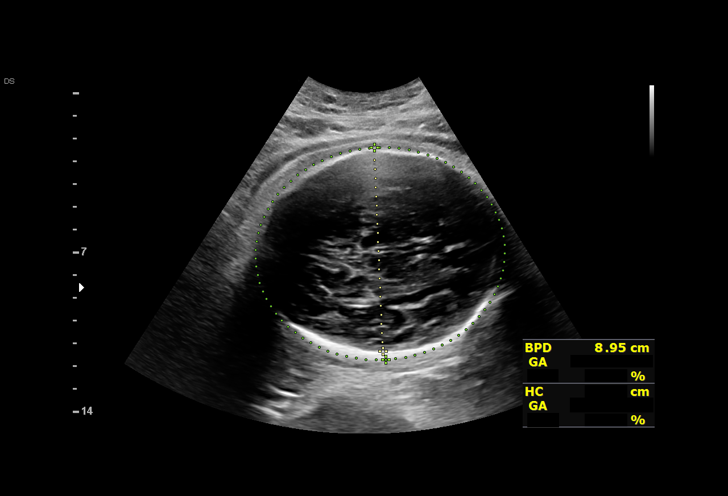
[im 13/27]
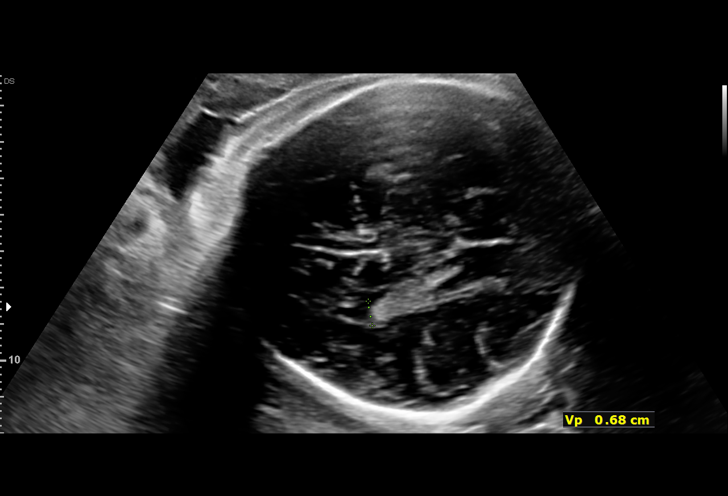
[im 15/27]
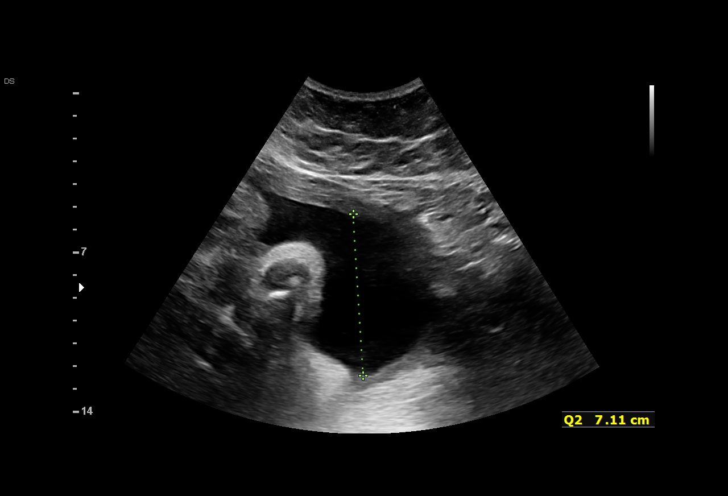
[im 17/27]
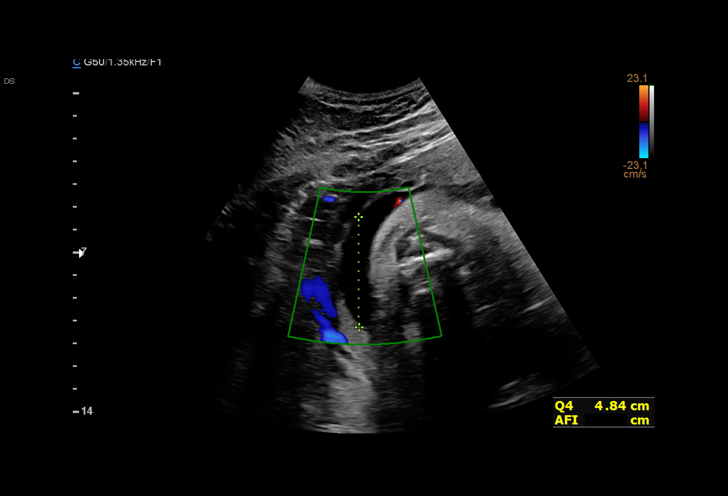
[im 19/27]
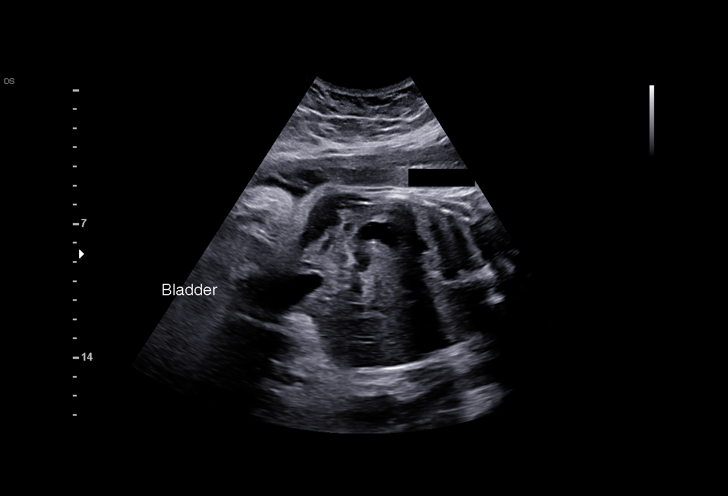
[im 21/27]
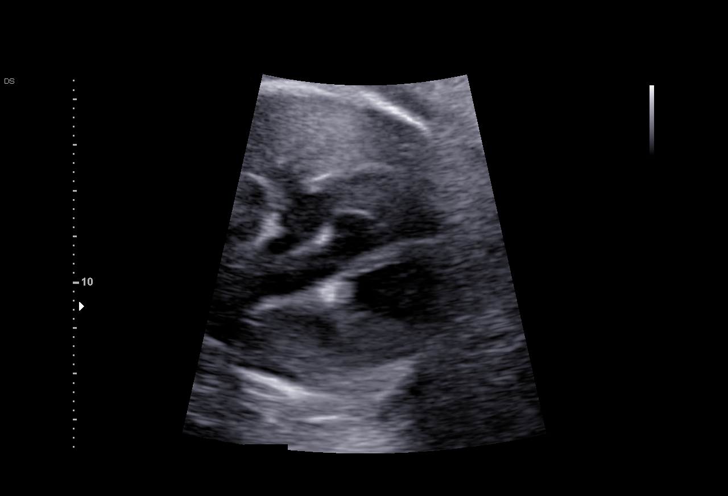
[im 23/27]
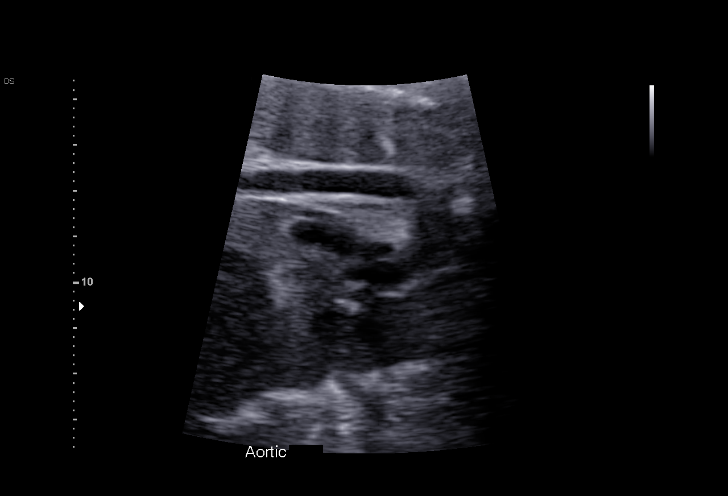
[im 25/27]
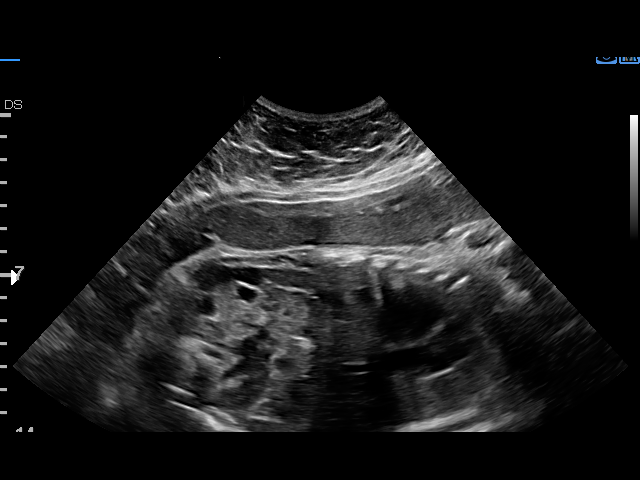
[im 27/27]
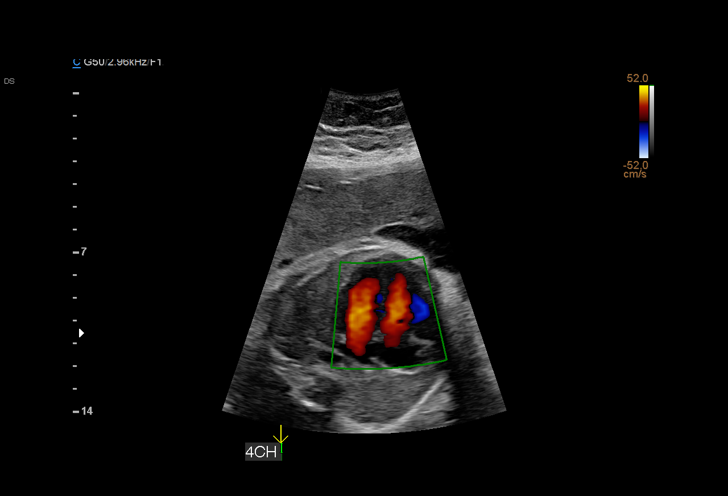

[14 of 27 positions shown; findings below may reference images not displayed]

[REDACTED]care

Indications

 Hypertension - Chronic/Pre-existing
 (labetalol)
 Gestational diabetes in pregnancy,
 controlled by oral hypoglycemic drugs
 (normal Fetal ECHO)
 Obesity complicating pregnancy, second
 trimester (BMI 38.6)
 Asthma                                         099.99 j79.999
 35 weeks gestation of pregnancy
Fetal Evaluation

 Num Of Fetuses:         1
 Cardiac Activity:       Observed
 Presentation:           Cephalic
 Placenta:               Left Anterior
 P. Cord Insertion:      Previously Visualized

 Amniotic Fluid
 AFI FV:      Within normal limits

 AFI Sum(cm)     %Tile       Largest Pocket(cm)
 20.92           79

 RUQ(cm)       RLQ(cm)       LUQ(cm)        LLQ(cm)

Biophysical Evaluation

 Amniotic F.V:   Pocket => 2 cm             F. Tone:        Observed
 F. Movement:    Observed                   Score:          [DATE]
 F. Breathing:   Observed
Biometry

 BPD:      89.3  mm     G. Age:  36w 1d         77  %    CI:        79.52   %    70 - 86
                                                         FL/HC:      21.8   %    20.1 -
 HC:      316.5  mm     G. Age:  35w 4d         24  %    HC/AC:      0.95        0.93 -
 AC:      334.6  mm     G. Age:  37w 3d         96  %    FL/BPD:     77.2   %    71 - 87
 FL:       68.9  mm     G. Age:  35w 3d         46  %    FL/AC:      20.6   %    20 - 24
 LV:        6.8  mm

 Est. FW:    9300  gm      6 lb 9 oz     82  %
OB History

 Gravidity:    1
 Living:       0
Gestational Age

 LMP:           35w 2d        Date:  03/30/20                 EDD:   01/04/21
 U/S Today:     36w 1d                                        EDD:   12/29/20
 Best:          35w 2d     Det. By:  LMP  (03/30/20)          EDD:   01/04/21
Anatomy

 Cranium:               Appears normal         Aortic Arch:            Appears normal
 Cavum:                 Appears normal         Ductal Arch:            Previously seen
 Ventricles:            Appears normal         Diaphragm:              Previously seen
 Choroid Plexus:        Appears normal         Stomach:                Appears normal, left
                                                                       sided
 Cerebellum:            Appears normal         Abdomen:                Previously seen
 Posterior Fossa:       Appears normal         Abdominal Wall:         Appears nml (cord
                                                                       insert, abd wall)
 Nuchal Fold:           Not applicable (>20    Cord Vessels:           Previously seen
                        wks GA)
 Face:                  Appears normal         Kidneys:                Appear normal
                        (orbits and profile)
 Lips:                  Appears normal         Bladder:                Appears normal
 Thoracic:              Previously seen        Spine:                  Previously seen
 Heart:                 Appears normal         Upper Extremities:      Previously seen
                        (4CH, axis, and
                        situs)
 RVOT:                  Appears normal         Lower Extremities:      Previously seen
 LVOT:                  Appears normal

 Other:  Re-evaluated anatomy appears normal.
Impression

 Chronic hypertension. Well-controlled on labetalol .  Blood
 pressure today at her office is 125/71 mmHg.

 Gestational diabetes. Patient takes glyburide for control and
 reports her blood glucose levels are within normal range.

 Fetal growth is appropriate for gestational age .Amniotic fluid
 is normal and good fetal activity is seen .Antenatal testing is
 reassuring. BPP [DATE].
Recommendations

 Weekly BPP till delivery.
                 Janula, Abrishyy

## 2022-04-10 ENCOUNTER — Ambulatory Visit: Payer: Medicaid Other | Admitting: Podiatry

## 2022-04-10 DIAGNOSIS — M67472 Ganglion, left ankle and foot: Secondary | ICD-10-CM | POA: Diagnosis not present

## 2022-04-10 DIAGNOSIS — E119 Type 2 diabetes mellitus without complications: Secondary | ICD-10-CM | POA: Diagnosis not present

## 2022-04-12 NOTE — Progress Notes (Signed)
?  Subjective:  ?Patient ID: Joanna Rogers, female    DOB: 04/21/1996,  MRN: [id number] ? ?Chief Complaint  ?Patient presents with  ? Ganglion Cyst  ? ? ?26 y.o. female presents with the above complaint. History confirmed with patient.  She has a lump on the outside of the left foot that has been worsening since she had pregnancy in the last year ? ? ?Interval history: ?She has not had much pain relief after the attempted drainage and injection.  Continues to be very bothersome.  She is interested in surgical excision. ? ?Objective:  ?Physical Exam: ?warm, good capillary refill, no trophic changes or ulcerative lesions, normal DP and PT pulses, and normal sensory exam. ?Left Foot: Palpable fluctuant area likely consistent with ganglion cyst over fourth metatarsal base ? ?Radiographs: ?Multiple views x-ray of the left foot: no fracture, dislocation, swelling or degenerative changes noted ? ?Study Result ? ?Narrative & Impression  ?CLINICAL DATA:  Mass along the dorsal aspect of the foot. ?  ?EXAM: ?MRI OF THE LEFT FOREFOOT WITHOUT AND WITH CONTRAST ?  ?TECHNIQUE: ?Multiplanar, multisequence MR imaging of the left forefoot was ?performed both before and after administration of intravenous ?contrast. ?  ?CONTRAST:  72mL MULTIHANCE GADOBENATE DIMEGLUMINE 529 MG/ML IV SOLN ?  ?COMPARISON:  None. ?  ?FINDINGS: ?Bones/Joint/Cartilage ?  ?No fracture or dislocation. Normal alignment. No joint effusion. No ?marrow signal abnormality. ?  ?Ligaments ?  ?Collateral ligaments are intact. Lisfranc ligament is intact. ?  ?Muscles and Tendons ?  ?Flexor, peroneal and extensor compartment tendons are intact. ?Muscles are normal. ?  ?Soft tissue ?1.3 x 1.1 x 4.1 cm well-defined T2 hyperintense, T1 hypointense, ?nonenhancing mass along the dorsal aspect of the third and fourth ?metatarsal bases most consistent with a ganglion cyst. Few thin ?septations along the distal aspect. No soft tissue mass. ?  ?IMPRESSION: ?1. A 1.3 x 1.1 x 4.1  cm ganglion cyst along the dorsal aspect of the ?third and fourth metatarsal bases. ?  ?  ?Electronically Signed ?  By: Elige Ko M.D. ?  On: 01/22/2022 06:56  ? ?Assessment:  ? ?1. Ganglion cyst of left foot   ?2. Type 2 diabetes mellitus without complication, without long-term current use of insulin (HCC)   ? ? ? ?Plan:  ?Patient was evaluated and treated and all questions answered. ? ?Continues to be quite bothersome for her.  Aspiration was unsuccessful and the injection did not alleviate any of her symptoms as well.  She is interested in proceeding with surgical excision.  I discussed the risk benefits and potential complications of this including not limited to pain, swelling, infection, scar, numbness which may be temporary or permanent, chronic pain, stiffness, nerve pain or damage, wound healing problems.  She understands and wishes to proceed inform consent was signed and reviewed.  Surgery be scheduled mutually agreeable date.  I would like to have her A1c checked prior to surgery. ? ? ?Surgical plan: ? ?Procedure: ?-Excision of ganglion cyst left foot ? ?Location: ?-[facility] ? ?Anesthesia plan: ?-IV sedation with local ? ?Postoperative pain plan: ?-Tylenol 1000 mg every 6 hours, ibuprofen 600 mg every 6 hours, gabapentin 300 mg every 8 hours x5 days, oxycodone 5 mg 1-2 tabs every 6 hours only as needed ? ?DVT prophylaxis: ?-None required ? ?WB Restrictions / DME needs: ?-WBAT in CAM boot which we will dispense at the surgical center ? ? ? ?No follow-ups on file.  ? ? ?

## 2022-04-26 ENCOUNTER — Telehealth: Payer: Self-pay

## 2022-04-26 NOTE — Telephone Encounter (Signed)
DOS 05/11/2022 ? ?EXC GANGLION CYST LT - 28090 ? ?[employer] ? ?SPOKE TO SHERRY AT [employer], SHE STATED NO PRECERT REQUIRED FOR CPT 28090. CALL REF # [id number] ?

## 2022-05-01 LAB — HEMOGLOBIN A1C
Est. average glucose Bld gHb Est-mCnc: 212 mg/dL
Hgb A1c MFr Bld: 9 % — ABNORMAL HIGH (ref 4.8–5.6)

## 2022-05-17 ENCOUNTER — Encounter: Payer: Medicaid Other | Admitting: Podiatry

## 2022-05-31 ENCOUNTER — Encounter: Payer: Medicaid Other | Admitting: Podiatry

## 2022-06-21 ENCOUNTER — Encounter: Payer: Medicaid Other | Admitting: Podiatry
